# Patient Record
Sex: Female | Born: 1985 | Race: White | Hispanic: No | Marital: Single | Smoking: Never smoker
Health system: Southern US, Community
[De-identification: ages and names within clinical notes are randomized; demographics above are authoritative.]

## PROBLEM LIST (undated history)

## (undated) DIAGNOSIS — M1990 Unspecified osteoarthritis, unspecified site: Secondary | ICD-10-CM

## (undated) DIAGNOSIS — I10 Essential (primary) hypertension: Secondary | ICD-10-CM

## (undated) HISTORY — DX: Unspecified osteoarthritis, unspecified site: M19.90

## (undated) HISTORY — PX: WISDOM TOOTH EXTRACTION: SHX21

## (undated) HISTORY — DX: Essential (primary) hypertension: I10

---
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
## 2016-02-15 DIAGNOSIS — M79672 Pain in left foot: Secondary | ICD-10-CM

## 2016-02-15 DIAGNOSIS — M79671 Pain in right foot: Secondary | ICD-10-CM | POA: Insufficient documentation

## 2016-02-15 DIAGNOSIS — Z79899 Other long term (current) drug therapy: Secondary | ICD-10-CM | POA: Insufficient documentation

## 2016-02-15 DIAGNOSIS — M0579 Rheumatoid arthritis with rheumatoid factor of multiple sites without organ or systems involvement: Secondary | ICD-10-CM | POA: Insufficient documentation

## 2016-02-15 DIAGNOSIS — M79643 Pain in unspecified hand: Secondary | ICD-10-CM | POA: Insufficient documentation

## 2016-02-15 DIAGNOSIS — I7300 Raynaud's syndrome without gangrene: Secondary | ICD-10-CM | POA: Insufficient documentation

## 2016-02-15 NOTE — Progress Notes (Signed)
Office Visit Note  Patient: Ruth Wells             Date of Birth: [birth year]           MRN: [id number]             PCP: No primary care provider on file. Referring: No ref. provider found Visit Date: 02/26/2016 Occupation: [employer]    Subjective:  Hand and wrist pain.  History of Present Illness: Ruth Wells is a 30 y.o. female with history of rheumatoid arthritis. She states in the last few months she's been having recurrent swelling to her hands and wrists joints, and ankles and feet. She has had some discomfort in her shoulder joints and elbow joints. She stopped sulfasalazine in the last week of December as it is not effective. She is interested in starting on methotrexate as it was discussed during the last visit.  Activities of Daily Living:  Patient reports morning stiffness for 30 minutes.   Patient Denies nocturnal pain.  Difficulty dressing/grooming: Denies Difficulty climbing stairs: Reports Difficulty getting out of chair: Denies Difficulty using hands for taps, buttons, cutlery, and/or writing: Denies   Review of Systems  Constitutional: Negative for fatigue, night sweats, weight gain, weight loss and weakness.  HENT: Negative for mouth sores, trouble swallowing, trouble swallowing, mouth dryness and nose dryness.   Eyes: Negative for pain, redness, visual disturbance and dryness.  Respiratory: Negative for cough, shortness of breath and difficulty breathing.   Cardiovascular: Negative for chest pain, palpitations, hypertension, irregular heartbeat and swelling in legs/feet.  Gastrointestinal: Negative for blood in stool, constipation and diarrhea.  Endocrine: Negative for increased urination.  Genitourinary: Negative for vaginal dryness.  Musculoskeletal: Positive for arthralgias, joint pain, joint swelling and morning stiffness. Negative for myalgias, muscle weakness, muscle tenderness and myalgias.  Skin: Negative for color change,  rash, hair loss, skin tightness, ulcers and sensitivity to sunlight.  Allergic/Immunologic: Negative for susceptible to infections.  Neurological: Negative for dizziness, memory loss and night sweats.  Hematological: Negative for swollen glands.  Psychiatric/Behavioral: Negative for depressed mood and sleep disturbance. The patient is not nervous/anxious.     PMFS History:  Patient Active Problem List   Diagnosis Date Noted  . Primary osteoarthritis of both hands 02/24/2016  . Primary osteoarthritis of both feet 02/24/2016  . High risk medication use 02/15/2016  . Raynaud's syndrome without gangrene 02/15/2016  . Rheumatoid arthritis with rheumatoid factor of multiple sites without organ or systems involvement ([place]) 02/15/2016    Past Medical History:  Diagnosis Date  . Arthritis     Family History  Problem Relation Age of Onset  . Arthritis Mother    Past Surgical History:  Procedure Laterality Date  . WISDOM TOOTH EXTRACTION     Social History   Social History Narrative  . No narrative on file     Objective: Vital Signs: BP 139/90 (BP Location: Left Arm, Patient Position: Sitting, Cuff Size: Normal)   Pulse 89   Resp 14   Ht '5\' 5"'  (1.651 m)   Wt 160 lb (72.6 kg)   LMP 01/30/2016   BMI 26.63 kg/m    Physical Exam  Constitutional: She is oriented to person, place, and time. She appears well-developed and well-nourished.  HENT:  Head: Normocephalic and atraumatic.  Eyes: Conjunctivae and EOM are normal.  Neck: Normal range of motion.  Cardiovascular: Normal rate, regular rhythm, normal heart sounds and intact distal pulses.   Pulmonary/Chest: Effort normal  and breath sounds normal.  Abdominal: Soft. Bowel sounds are normal.  Lymphadenopathy:    She has no cervical adenopathy.  Neurological: She is alert and oriented to person, place, and time.  Skin: Skin is warm and dry. Capillary refill takes less than 2 seconds.  Psychiatric: She has a normal mood and  affect. Her behavior is normal.  Nursing note and vitals reviewed.    Musculoskeletal Exam: C-spine, thoracic, lumbar spine good range of motion. No SI joint tenderness. Shoulder joints elbow joints wrist joints with good range of motion. She has tenderness over some of her MCP joints and PIP joints she had synovitis over her left fourth PIP joint. Hip joints, knee joints, ankle joints, MTPs PIPs with good range of motion. She has thickening of her left Achilles tendon probably a rheumatoid nodule.  CDAI Exam: CDAI Homunculus Exam:   Tenderness:  Left hand: 4th PIP  Swelling:  Left hand: 4th PIP  Joint Counts:  CDAI Tender Joint count: 1 CDAI Swollen Joint count: 1  Global Assessments:  Patient Global Assessment: 5 Provider Global Assessment: 5  CDAI Calculated Score: 12    Investigation: Findings:  10/03/2015 HPI:  Ruth Wells is here to get ultrasound examination of her hands and feet due to a new erosive change noted on her recent x-ray.    PROCEDURE:  After informed consent was obtained per EULAR recommendation, ultrasound examination of the bilateral hands was performed.  Using 12 MHz transducer, grayscale and power Doppler, bilateral 2nd, 3rd and 5th MCP joints and bilateral wrist joints both dorsal and volar aspects were evaluated.  The findings were she had no synovitis on examination.  No synovial thickening was noted and no erosive changes were noted.  Right median nerve was 0.05 cm square and left median nerve was 0.08 cm square which were within normal limits.  Bilateral feet ultrasound was also obtained.   Using 12 MHz transducer, grayscale and power Doppler, bilateral 1st, 2nd, 4th and 5th MTP joints both dorsal and volar aspects were evaluated.  She had mild synovitis in her right 2nd and 4th and 5th MTP joint with some synovial thickening.  Erosion was noted on her right 5th MTP joint.  Her left 1st and 2nd MTP joint showed some synovial thickening and mild synovitis  and left 1st MTP joint.  These findings were consistent with inflammatory arthritis.  The erosion was noted in the right 5th MTP joint.     PLAN:  We had detailed discussion regarding these findings.  I did discuss that methotrexate would be more appropriate and more aggressive therapy to control any radiographic progression.  At this point she has not decided starting on methotrexate.    09/24/2015 .   We obtained some x-rays for comparison.  X-rays of bilateral hands, 2 views, showed bilateral PIP narrowing, metacarpocarpal and intercarpal joint space narrowing more so on the right than the left, and there was a questionable right ulnar styloid erosion.  There was no interval change from her x-rays in 2016.   X-rays of bilateral feet, 2 views, showed bilateral PIP and DIP narrowing, right 5th MTP erosion.  I did not have any comparison films since 2011.  There appears to be a new erosion.  10/08/2015 HIV, TB gold, and Hepatitis panel negative , immunoglobulins normal, SPEP negative    Imaging: No results found.  Speciality Comments: No specialty comments available.    Procedures:  No procedures performed Allergies: Patient has no known allergies.   Assessment /  Plan:     Visit Diagnoses: Rheumatoid arthritis with rheumatoid factor  - Positive rheumatoid factor, synovitis and right fifth MTP erosion on [place], negative CCP, negative ANA, negative HLA-B27, history ofJRA. She's not responding to sulfasalazine and Plaquenil combination. We had detailed discussion regarding different treatment options and their side effects. Indications side effects contraindications of methotrexate were discussed at length by Dr. Koleen Nimrod. Handout was given to the patient and a consent was taken. She is on oral contraceptive pills. We will obtain a baseline chest x-ray PA and lateral. I will also obtain labs today. Than the labs will be done every 2 weeks 3 every 2 months then every 3 months based on her response  to the medication. She will also be given prescription for methotrexate 2.5 mg tablets, 4 tablets by mouth every week 2 then 6 tablets by mouth every weeks 2 then 8 tablets by mouth every week. Folic acid 1 mg 2 tablets by mouth daily 90 day supply with 3 refills will be given. She's been given a prescription for pneumococcal vaccine which she can get at a local pharmacy.  High risk medication use - she is only on Plaquenil 200 mg 1 by mouth twice a day Monday to Friday. She discontinued sulfasalazine in the last week of December as it was ineffective.  Raynaud's syndrome without gangrene: Not very active currently  Primary osteoarthritis of both hands: Minimal discomfort  Primary osteoarthritis of both feet : Minimal discomfort  Patient does not have a PCP I have advised her to schedule an appointment with one.   Orders: Orders Placed This Encounter  Procedures  . XR Chest 2 View  . CBC with Differential/Platelet  . COMPLETE METABOLIC PANEL WITH GFR  . CBC with Differential/Platelet  . COMPLETE METABOLIC PANEL WITH GFR   Meds ordered this encounter  Medications  . methotrexate (RHEUMATREX) 2.5 MG tablet    Sig: Take 1 tablet (2.5 mg total) by mouth once a week. Caution:Chemotherapy. Protect from light. Methotrexate 2.5 mg tablets, 4 tablets by mouth every week for 2 weeks then6 tablets by mouth every week for 2 weeks then 8 tablets by mouth every week    Dispense:  36 tablet    Refill:  0  . folic acid (FOLVITE) 1 MG tablet    Sig: Take 2 tablets (2 mg total) by mouth daily.    Dispense:  180 tablet    Refill:  4    Face-to-face time spent with patient was 30 minutes. 50% of time was spent in counseling and coordination of care.  Follow-Up Instructions: Return in about 3 months (around 05/26/2016) for Rheumatoid arthritis.   Bo Merino, MD

## 2016-02-24 DIAGNOSIS — M19041 Primary osteoarthritis, right hand: Secondary | ICD-10-CM | POA: Insufficient documentation

## 2016-02-24 DIAGNOSIS — M19072 Primary osteoarthritis, left ankle and foot: Secondary | ICD-10-CM

## 2016-02-24 DIAGNOSIS — M19071 Primary osteoarthritis, right ankle and foot: Secondary | ICD-10-CM | POA: Insufficient documentation

## 2016-02-24 DIAGNOSIS — M19042 Primary osteoarthritis, left hand: Secondary | ICD-10-CM

## 2016-02-26 ENCOUNTER — Encounter: Payer: Self-pay | Admitting: Rheumatology

## 2016-02-26 ENCOUNTER — Ambulatory Visit (HOSPITAL_COMMUNITY)
Admission: RE | Admit: 2016-02-26 | Discharge: 2016-02-26 | Disposition: A | Discharge status: Home / Self Care | Payer: BC Managed Care – PPO | Source: Ambulatory Visit | Attending: Rheumatology | Admitting: Rheumatology

## 2016-02-26 ENCOUNTER — Ambulatory Visit (INDEPENDENT_AMBULATORY_CARE_PROVIDER_SITE_OTHER): Payer: BC Managed Care – PPO | Admitting: Rheumatology

## 2016-02-26 VITALS — BP 139/90 | HR 89 | Resp 14 | Ht 65.0 in | Wt 160.0 lb

## 2016-02-26 DIAGNOSIS — M0579 Rheumatoid arthritis with rheumatoid factor of multiple sites without organ or systems involvement: Secondary | ICD-10-CM | POA: Diagnosis present

## 2016-02-26 DIAGNOSIS — Z79899 Other long term (current) drug therapy: Secondary | ICD-10-CM | POA: Diagnosis not present

## 2016-02-26 DIAGNOSIS — M19041 Primary osteoarthritis, right hand: Secondary | ICD-10-CM | POA: Diagnosis not present

## 2016-02-26 DIAGNOSIS — Z111 Encounter for screening for respiratory tuberculosis: Secondary | ICD-10-CM

## 2016-02-26 DIAGNOSIS — M19072 Primary osteoarthritis, left ankle and foot: Secondary | ICD-10-CM | POA: Diagnosis not present

## 2016-02-26 DIAGNOSIS — M19042 Primary osteoarthritis, left hand: Secondary | ICD-10-CM

## 2016-02-26 DIAGNOSIS — M19071 Primary osteoarthritis, right ankle and foot: Secondary | ICD-10-CM

## 2016-02-26 DIAGNOSIS — I7300 Raynaud's syndrome without gangrene: Secondary | ICD-10-CM

## 2016-02-26 MED ORDER — FOLIC ACID 1 MG PO TABS: 2.0000 mg | ORAL_TABLET | Freq: Every day | ORAL | 4 refills | Status: DC

## 2016-02-26 MED ORDER — METHOTREXATE 2.5 MG PO TABS: 2.5000 mg | ORAL_TABLET | ORAL | 0 refills | Status: DC

## 2016-02-26 NOTE — Progress Notes (Signed)
Pharmacy Note  Subjective: Patient presents today to the [facility] to see Dr. Estanislado Pandy.  She is currently taking hydroxcyhloroquine 200 mg BID Monday through Friday.  She stopped taking sulfasalazine last week.  Patient was previously counseled on methotrexate on 10/03/15 but at that time she wanted to wait.  Patient reports she is interested in starting methotrexate at this time.  Patient seen by the pharmacist for counseling on methotrexate.    Objective: CBC (09/20/15) WBC: 5.2 K/uL RBC: 4.64 MIL/uL Hgb: 14.6 g/dL Hct: 44 % PLT: 184 K/uL  CMP (09/20/15) SCr: 1.05 mg/dL BUN: 13 mg/dL AST: 22 U/L ALT: 21 U/L  TB Gold: negative (10/03/15) Hepatitis panel: negative (10/03/15) HIV: negative (10/03/15)  Chest-xray:  Ordered today  Assessment/Plan:  Patient was counseled on the purpose, proper use, and adverse effects of methotrexate including nausea, infection, and signs and symptoms of pneumonitis.  Reviewed instructions with patient to take methotrexate weekly along with folic acid daily.  Discussed the importance of frequent monitoring of kidney and liver function and blood counts, and provided patient with standing lab instructions.  Counseled patient to avoid sulfa antibiotics such as Bactrim or Septra while on methotrexate.  Provided patient with educational materials on methotrexate and answered all questions.  Advised patient to get annual influenza vaccine and to get a pneumococcal vaccine if patient has not already had one.  Patient confirms she had a influenza vaccine this year.  Patient was given a prescription for a pneumococcal vaccine.  Patient consented to methotrexate use.  Advised patient to get chest X-ray and pneumococcal vaccine prior to initiation.  Patient voiced understanding.    Most recent hydroxychloroquine eye exam on file is from 09/22/14.  She reports she had a hydroxychloroquine eye exam in July 2017 with Dr. Ellie Lunch at [facility] which she  reports was normal.  I faxed their office with our eye exam form to get the records from this visit.    Elisabeth Most, Pharm.D., BCPS Clinical Pharmacist Pager: [phone] Phone: [phone] 02/26/2016 8:36 AM

## 2016-02-26 NOTE — Addendum Note (Signed)
Addended byCandice Camp on: 02/26/2016 10:06 AM   Modules accepted: Orders

## 2016-02-26 NOTE — Patient Instructions (Addendum)
Please go to [facility] to get a chest X-ray.  We will plan to start methotrexate after your chest X-ray.    Methotrexate instructions:   Take methotrexate 4 tablets (10 mg) weekly for two weeks, get labs 2 weeks after starting, then increase dose to 6 tablets (15 mg) weekly for two weeks, then get labs, then increase dose to 8 tablets (20 mg) weekly.  Get labs again 2 weeks after starting 8 tablets weekly.    Take folic acid 2 mg daily.    Please get a pneumococcal vaccine.   Standing Labs We placed an order today for your standing lab work.    Please come back and get your standing labs every 2 weeks times 3, then every 2 months.  We have open lab Monday through Friday from 8:30-11:30 AM and 1:30-4 PM at the office of Dr. Tresa Moore, PA.   The office is located at [address], [place], [place], [place] [address] No appointment is necessary.   Labs are drawn by [facility].  You may receive a bill from [facility] for your lab work.    Methotrexate tablets What is this medicine? METHOTREXATE (METH oh TREX ate) is a chemotherapy drug used to treat cancer including breast cancer, leukemia, and lymphoma. This medicine can also be used to treat psoriasis and certain kinds of arthritis. This medicine may be used for other purposes; ask your health care provider or pharmacist if you have questions. COMMON BRAND NAME(S): Rheumatrex, Trexall What should I tell my health care provider before I take this medicine? They need to know if you have any of these conditions: -fluid in the stomach area or lungs -if you often drink alcohol -infection or immune system problems -kidney disease or on hemodialysis -liver disease -low blood counts, like low white cell, platelet, or red cell counts -lung disease -radiation therapy -stomach ulcers -ulcerative colitis -an unusual or allergic reaction to methotrexate, other medicines, foods, dyes, or preservatives -pregnant or trying to  get pregnant -breast-feeding How should I use this medicine? Take this medicine by mouth with a glass of water. Follow the directions on the prescription label. Take your medicine at regular intervals. Do not take it more often than directed. Do not stop taking except on your doctor's advice. Make sure you know why you are taking this medicine and how often you should take it. If this medicine is used for a condition that is not cancer, like arthritis or psoriasis, it should be taken weekly, NOT daily. Taking this medicine more often than directed can cause serious side effects, even death. Talk to your healthcare provider about safe handling and disposal of this medicine. You may need to take special precautions. Talk to your pediatrician regarding the use of this medicine in children. While this drug may be prescribed for selected conditions, precautions do apply. Overdosage: If you think you have taken too much of this medicine contact a poison control center or emergency room at once. NOTE: This medicine is only for you. Do not share this medicine with others. What if I miss a dose? If you miss a dose, talk with your doctor or health care professional. Do not take double or extra doses. What may interact with this medicine? This medicine may interact with the following medication: -acitretin -aspirin and aspirin-like medicines including salicylates -azathioprine -certain antibiotics like penicillins, tetracycline, and chloramphenicol -cyclosporine -gold -hydroxychloroquine -live virus vaccines -NSAIDs, medicines for pain and inflammation, like ibuprofen or naproxen -other cytotoxic agents -  penicillamine -phenylbutazone -phenytoin -probenecid -retinoids such as isotretinoin and tretinoin -steroid medicines like prednisone or cortisone -sulfonamides like sulfasalazine and trimethoprim/sulfamethoxazole -theophylline This list may not describe all possible interactions. Give your  health care provider a list of all the medicines, herbs, non-prescription drugs, or dietary supplements you use. Also tell them if you smoke, drink alcohol, or use illegal drugs. Some items may interact with your medicine. What should I watch for while using this medicine? Avoid alcoholic drinks. This medicine can make you more sensitive to the sun. Keep out of the sun. If you cannot avoid being in the sun, wear protective clothing and use sunscreen. Do not use sun lamps or tanning beds/booths. You may need blood work done while you are taking this medicine. Call your doctor or health care professional for advice if you get a fever, chills or sore throat, or other symptoms of a cold or flu. Do not treat yourself. This drug decreases your body's ability to fight infections. Try to avoid being around people who are sick. This medicine may increase your risk to bruise or bleed. Call your doctor or health care professional if you notice any unusual bleeding. Check with your doctor or health care professional if you get an attack of severe diarrhea, nausea and vomiting, or if you sweat a lot. The loss of too much body fluid can make it dangerous for you to take this medicine. Talk to your doctor about your risk of cancer. You may be more at risk for certain types of cancers if you take this medicine. Both men and women must use effective birth control with this medicine. Do not become pregnant while taking this medicine or until at least 1 normal menstrual cycle has occurred after stopping it. Women should inform their doctor if they wish to become pregnant or think they might be pregnant. Men should not father a child while taking this medicine and for 3 months after stopping it. There is a potential for serious side effects to an unborn child. Talk to your health care professional or pharmacist for more information. Do not breast-feed an infant while taking this medicine. What side effects may I notice from  receiving this medicine? Side effects that you should report to your doctor or health care professional as soon as possible: -allergic reactions like skin rash, itching or hives, swelling of the face, lips, or tongue -breathing problems or shortness of breath -diarrhea -dry, nonproductive cough -low blood counts - this medicine may decrease the number of white blood cells, red blood cells and platelets. You may be at increased risk for infections and bleeding. -mouth sores -redness, blistering, peeling or loosening of the skin, including inside the mouth -signs of infection - fever or chills, cough, sore throat, pain or trouble passing urine -signs and symptoms of bleeding such as bloody or black, tarry stools; red or dark-brown urine; spitting up blood or brown material that looks like coffee grounds; red spots on the skin; unusual bruising or bleeding from the eye, gums, or nose -signs and symptoms of kidney injury like trouble passing urine or change in the amount of urine -signs and symptoms of liver injury like dark yellow or brown urine; general ill feeling or flu-like symptoms; light-colored stools; loss of appetite; nausea; right upper belly pain; unusually weak or tired; yellowing of the eyes or skin Side effects that usually do not require medical attention (report to your doctor or health care professional if they continue or are bothersome): -dizziness -hair  loss -tiredness -upset stomach -vomiting This list may not describe all possible side effects. Call your doctor for medical advice about side effects. You may report side effects to FDA at 1-800-FDA-1088. Where should I keep my medicine? Keep out of the reach of children. Store at room temperature between 20 and 25 degrees C (68 and 77 degrees F). Protect from light. Throw away any unused medicine after the expiration date. NOTE: This sheet is a summary. It may not cover all possible information. If you have questions about  this medicine, talk to your doctor, pharmacist, or health care provider.  2017 Elsevier/Gold Standard (2014-10-16 05:39:22)

## 2016-02-27 LAB — CBC WITH DIFFERENTIAL/PLATELET
Basophils Absolute: 0 cells/uL (ref 0–200)
Basophils Relative: 0 %
EOS PCT: 0 %
Eosinophils Absolute: 0 cells/uL — ABNORMAL LOW (ref 15–500)
HCT: 46.1 % — ABNORMAL HIGH (ref 35.0–45.0)
HEMOGLOBIN: 15.3 g/dL (ref 11.7–15.5)
LYMPHS ABS: 2303 {cells}/uL (ref 850–3900)
LYMPHS PCT: 49 %
MCH: 30.9 pg (ref 27.0–33.0)
MCHC: 33.2 g/dL (ref 32.0–36.0)
MCV: 93.1 fL (ref 80.0–100.0)
MONOS PCT: 8 %
MPV: 9.7 fL (ref 7.5–12.5)
Monocytes Absolute: 376 cells/uL (ref 200–950)
NEUTROS PCT: 43 %
Neutro Abs: 2021 cells/uL (ref 1500–7800)
PLATELETS: 171 10*3/uL (ref 140–400)
RBC: 4.95 MIL/uL (ref 3.80–5.10)
RDW: 13.1 % (ref 11.0–15.0)
WBC: 4.7 10*3/uL (ref 3.8–10.8)

## 2016-02-27 LAB — COMPLETE METABOLIC PANEL WITH GFR
ALT: 15 U/L (ref 6–29)
AST: 20 U/L (ref 10–30)
Albumin: 4.1 g/dL (ref 3.6–5.1)
Alkaline Phosphatase: 47 U/L (ref 33–115)
BUN: 11 mg/dL (ref 7–25)
CHLORIDE: 106 mmol/L (ref 98–110)
CO2: 26 mmol/L (ref 20–31)
Calcium: 9 mg/dL (ref 8.6–10.2)
Creat: 0.93 mg/dL (ref 0.50–1.10)
GFR, EST NON AFRICAN AMERICAN: 83 mL/min (ref >=60)
GFR, Est African American: >89 mL/min (ref >=60)
GLUCOSE: 82 mg/dL (ref 65–99)
POTASSIUM: 4.5 mmol/L (ref 3.5–5.3)
SODIUM: 139 mmol/L (ref 135–146)
Total Bilirubin: 0.4 mg/dL (ref 0.2–1.2)
Total Protein: 6.6 g/dL (ref 6.1–8.1)

## 2016-02-27 NOTE — Progress Notes (Signed)
Labs normal.

## 2016-03-14 ENCOUNTER — Telehealth: Payer: Self-pay | Admitting: Pharmacist

## 2016-03-14 NOTE — Telephone Encounter (Signed)
Received call from patient stating she took her first dose of methotrexate last Friday.  She took 4 tablets and reports she tolerated the methotrexate well.  She confirms she is taking folic acid 2mg  daily.  Patient reports she got a cold this week, and asked whether she should hold her methotrexate dose this week.  I advised patient that she should hold her methotrexate until her cold has resolved.  I reminded patient that she should get standing labs after she has been on the medication for two weeks.  Patient voiced understanding.     Elisabeth Most, Pharm.D., BCPS, CPP Clinical Pharmacist Pager: [phone] Phone: [phone] 03/14/2016 8:12 AM

## 2016-03-18 ENCOUNTER — Encounter: Payer: Self-pay | Admitting: Rheumatology

## 2016-03-18 NOTE — Progress Notes (Signed)
  Patient: Ruth Wells  Plaquenil eye exam received from [facility].  Date of eye exam: 09/24/2015 Plaquenil toxicity was: Normal Plaquenil should be: Continued Date of follow-up eye exam: 12 months  Eye exam done by Dr. Ellie Lunch at [facility] [phone]  [name], PA-C

## 2016-03-21 ENCOUNTER — Other Ambulatory Visit: Payer: Self-pay | Admitting: Rheumatology

## 2016-03-21 NOTE — Telephone Encounter (Signed)
Last Visit: 02/26/16 Next Visit: 06/16/16 Labs: 02/26/16 WNL PLQ Eye Exam: 09/24/15 WNL  Okay to refill PLQ?

## 2016-03-21 NOTE — Telephone Encounter (Signed)
ok 

## 2016-03-26 ENCOUNTER — Other Ambulatory Visit: Payer: Self-pay | Admitting: *Deleted

## 2016-03-26 DIAGNOSIS — Z79899 Other long term (current) drug therapy: Secondary | ICD-10-CM

## 2016-03-26 LAB — CBC WITH DIFFERENTIAL/PLATELET
BASOS PCT: 0 %
Basophils Absolute: 0 cells/uL (ref 0–200)
Eosinophils Absolute: 0 cells/uL — ABNORMAL LOW (ref 15–500)
Eosinophils Relative: 0 %
HCT: 44.6 % (ref 35.0–45.0)
Hemoglobin: 15.1 g/dL (ref 11.7–15.5)
LYMPHS PCT: 47 %
Lymphs Abs: 2538 cells/uL (ref 850–3900)
MCH: 30.8 pg (ref 27.0–33.0)
MCHC: 33.9 g/dL (ref 32.0–36.0)
MCV: 90.8 fL (ref 80.0–100.0)
MPV: 9.8 fL (ref 7.5–12.5)
Monocytes Absolute: 378 cells/uL (ref 200–950)
Monocytes Relative: 7 %
Neutro Abs: 2484 cells/uL (ref 1500–7800)
Neutrophils Relative %: 46 %
PLATELETS: 205 10*3/uL (ref 140–400)
RBC: 4.91 MIL/uL (ref 3.80–5.10)
RDW: 13.1 % (ref 11.0–15.0)
WBC: 5.4 10*3/uL (ref 3.8–10.8)

## 2016-03-27 LAB — COMPLETE METABOLIC PANEL WITH GFR
ALT: 23 U/L (ref 6–29)
AST: 20 U/L (ref 10–30)
Albumin: 4.4 g/dL (ref 3.6–5.1)
Alkaline Phosphatase: 47 U/L (ref 33–115)
BUN: 11 mg/dL (ref 7–25)
CHLORIDE: 106 mmol/L (ref 98–110)
CO2: 21 mmol/L (ref 20–31)
CREATININE: 0.87 mg/dL (ref 0.50–1.10)
Calcium: 8.9 mg/dL (ref 8.6–10.2)
GFR, Est Non African American: >89 mL/min (ref >=60)
Glucose, Bld: 77 mg/dL (ref 65–99)
POTASSIUM: 4.5 mmol/L (ref 3.5–5.3)
Sodium: 141 mmol/L (ref 135–146)
Total Bilirubin: 0.3 mg/dL (ref 0.2–1.2)
Total Protein: 7.1 g/dL (ref 6.1–8.1)

## 2016-04-09 ENCOUNTER — Other Ambulatory Visit: Payer: Self-pay | Admitting: *Deleted

## 2016-04-09 DIAGNOSIS — Z79899 Other long term (current) drug therapy: Secondary | ICD-10-CM

## 2016-04-09 LAB — CBC WITH DIFFERENTIAL/PLATELET
BASOS PCT: 0 %
Basophils Absolute: 0 cells/uL (ref 0–200)
Eosinophils Absolute: 0 cells/uL — ABNORMAL LOW (ref 15–500)
Eosinophils Relative: 0 %
HCT: 42.9 % (ref 35.0–45.0)
Hemoglobin: 14.4 g/dL (ref 11.7–15.5)
LYMPHS PCT: 45 %
Lymphs Abs: 2520 cells/uL (ref 850–3900)
MCH: 30.8 pg (ref 27.0–33.0)
MCHC: 33.6 g/dL (ref 32.0–36.0)
MCV: 91.9 fL (ref 80.0–100.0)
MONOS PCT: 5 %
MPV: 9.7 fL (ref 7.5–12.5)
Monocytes Absolute: 280 cells/uL (ref 200–950)
Neutro Abs: 2800 cells/uL (ref 1500–7800)
Neutrophils Relative %: 50 %
PLATELETS: 177 10*3/uL (ref 140–400)
RBC: 4.67 MIL/uL (ref 3.80–5.10)
RDW: 13.4 % (ref 11.0–15.0)
WBC: 5.6 10*3/uL (ref 3.8–10.8)

## 2016-04-10 LAB — COMPLETE METABOLIC PANEL WITH GFR
ALT: 13 U/L (ref 6–29)
AST: 16 U/L (ref 10–30)
Albumin: 3.9 g/dL (ref 3.6–5.1)
Alkaline Phosphatase: 44 U/L (ref 33–115)
BUN: 10 mg/dL (ref 7–25)
CHLORIDE: 107 mmol/L (ref 98–110)
CO2: 22 mmol/L (ref 20–31)
CREATININE: 0.98 mg/dL (ref 0.50–1.10)
Calcium: 8.9 mg/dL (ref 8.6–10.2)
GFR, Est Non African American: 78 mL/min (ref >=60)
Glucose, Bld: 120 mg/dL — ABNORMAL HIGH (ref 65–99)
Potassium: 3.8 mmol/L (ref 3.5–5.3)
SODIUM: 140 mmol/L (ref 135–146)
Total Bilirubin: 0.3 mg/dL (ref 0.2–1.2)
Total Protein: 6.3 g/dL (ref 6.1–8.1)

## 2016-04-22 ENCOUNTER — Telehealth: Payer: Self-pay | Admitting: Rheumatology

## 2016-04-22 ENCOUNTER — Other Ambulatory Visit: Payer: Self-pay | Admitting: Rheumatology

## 2016-04-22 MED ORDER — METHOTREXATE 2.5 MG PO TABS: 20.0000 mg | ORAL_TABLET | ORAL | 2 refills | Status: DC

## 2016-04-22 NOTE — Telephone Encounter (Signed)
Added pharmacy to patient's chart.

## 2016-04-22 NOTE — Telephone Encounter (Signed)
Patient called to let you know that she uses [facility] at [address].

## 2016-04-22 NOTE — Telephone Encounter (Signed)
Left message for patient to contact the office with pharmacy information.   Last Visit: 02/26/16 Next visit: 06/16/16 Labs: 04/09/16 Elevated glucose  Okay to refill MTX?

## 2016-04-22 NOTE — Telephone Encounter (Signed)
ok 

## 2016-04-22 NOTE — Telephone Encounter (Signed)
Patient called stating that she has finished her first round of MTX and is needing her prescription refilled.  [phone].  Thank you.

## 2016-04-24 ENCOUNTER — Telehealth: Payer: Self-pay | Admitting: Rheumatology

## 2016-04-24 ENCOUNTER — Other Ambulatory Visit: Payer: Self-pay | Admitting: Rheumatology

## 2016-04-24 NOTE — Telephone Encounter (Signed)
Patient advised prescription has been faxed to the pharmacy.  

## 2016-04-24 NOTE — Telephone Encounter (Addendum)
Patient is requesting refill of MTX to be resent to [facility] on [address].

## 2016-06-03 NOTE — Progress Notes (Signed)
Office Visit Note  Patient: Ruth Wells             Date of Birth: 1985-02-26           MRN: [id number]             PCP: Logan Bores, MD Referring: Paula Compton, MD Visit Date: 06/16/2016 Occupation: _0 @    Subjective:  Left foot pain   History of Present Illness: Ruth Wells is a 31 y.o. female on methotrexate since mid-January. She states she's doing much better with decreased stiffness and decreased pain in her joints. She had an episode towards the end of March when she developed pain and swelling in her left first toe.  Activities of Daily Living:  Patient reports morning stiffness for 0 minute.   Patient Denies nocturnal pain.  Difficulty dressing/grooming: Denies Difficulty climbing stairs: Denies Difficulty getting out of chair: Denies Difficulty using hands for taps, buttons, cutlery, and/or writing: Denies   Review of Systems  Constitutional: Negative for fatigue, night sweats, weight gain, weight loss and weakness.  HENT: Negative for mouth sores, trouble swallowing, trouble swallowing, mouth dryness and nose dryness.   Eyes: Negative for pain, redness, visual disturbance and dryness.  Respiratory: Negative for cough, shortness of breath and difficulty breathing.   Cardiovascular: Negative for chest pain, palpitations, hypertension, irregular heartbeat and swelling in legs/feet.  Gastrointestinal: Negative for blood in stool, constipation and diarrhea.  Endocrine: Negative for increased urination.  Genitourinary: Negative for vaginal dryness.  Musculoskeletal: Positive for arthralgias and joint pain. Negative for joint swelling, myalgias, muscle weakness, morning stiffness, muscle tenderness and myalgias.  Skin: Negative for color change, rash, hair loss, skin tightness, ulcers and sensitivity to sunlight.  Allergic/Immunologic: Negative for susceptible to infections.  Neurological: Negative for dizziness, memory loss and night  sweats.  Hematological: Negative for swollen glands.  Psychiatric/Behavioral: Negative for depressed mood and sleep disturbance. The patient is not nervous/anxious.     PMFS History:  Patient Active Problem List   Diagnosis Date Noted  . Primary osteoarthritis of both hands 02/24/2016  . Primary osteoarthritis of both feet 02/24/2016  . High risk medication use 02/15/2016  . Raynaud's syndrome without gangrene 02/15/2016  . Rheumatoid arthritis with rheumatoid factor of multiple sites without organ or systems involvement ([place]) 02/15/2016    Past Medical History:  Diagnosis Date  . Arthritis     Family History  Problem Relation Age of Onset  . Arthritis Mother    Past Surgical History:  Procedure Laterality Date  . WISDOM TOOTH EXTRACTION     Social History   Social History Narrative  . No narrative on file     Objective: Vital Signs: BP 136/85 (BP Location: Left Arm, Patient Position: Sitting, Cuff Size: Normal)   Pulse 83   Resp 12   Ht 5' 5" (1.651 m)   Wt 160 lb (72.6 kg)   LMP 05/24/2016   BMI 26.63 kg/m    Physical Exam  Constitutional: She is oriented to person, place, and time. She appears well-developed and well-nourished.  HENT:  Head: Normocephalic and atraumatic.  Eyes: Conjunctivae and EOM are normal.  Neck: Normal range of motion.  Cardiovascular: Normal rate, regular rhythm, normal heart sounds and intact distal pulses.   Pulmonary/Chest: Effort normal and breath sounds normal.  Abdominal: Soft. Bowel sounds are normal.  Lymphadenopathy:    She has no cervical adenopathy.  Neurological: She is alert and oriented to person, place, and time.  Skin:  Skin is warm and dry. Capillary refill takes less than 2 seconds.  Psychiatric: She has a normal mood and affect. Her behavior is normal.  Nursing note and vitals reviewed.    Musculoskeletal Exam: Good range of motion. Shoulder joints elbow joints wrist joint MCPs PIPs DIPs with good range of motion  with no synovitis. MTPs PIPs DIPs with good range of motion with no synovitis or tenderness.  CDAI Exam: CDAI Homunculus Exam:   Joint Counts:  CDAI Tender Joint count: 0 CDAI Swollen Joint count: 0  Global Assessments:  Patient Global Assessment: 2 Provider Global Assessment: 2  CDAI Calculated Score: 4    Investigation: No additional findings. Orders Only on 04/09/2016  Component Date Value Ref Range Status  . WBC 04/09/2016 5.6  3.8 - 10.8 K/uL Final  . RBC 04/09/2016 4.67  3.80 - 5.10 MIL/uL Final  . Hemoglobin 04/09/2016 14.4  11.7 - 15.5 g/dL Final  . HCT 04/09/2016 42.9  35.0 - 45.0 % Final  . MCV 04/09/2016 91.9  80.0 - 100.0 fL Final  . MCH 04/09/2016 30.8  27.0 - 33.0 pg Final  . MCHC 04/09/2016 33.6  32.0 - 36.0 g/dL Final  . RDW 04/09/2016 13.4  11.0 - 15.0 % Final  . Platelets 04/09/2016 177  140 - 400 K/uL Final  . MPV 04/09/2016 9.7  7.5 - 12.5 fL Final  . Neutro Abs 04/09/2016 2800  1,500 - 7,800 cells/uL Final  . Lymphs Abs 04/09/2016 2520  850 - 3,900 cells/uL Final  . Monocytes Absolute 04/09/2016 280  200 - 950 cells/uL Final  . Eosinophils Absolute 04/09/2016 0* 15 - 500 cells/uL Final  . Basophils Absolute 04/09/2016 0  0 - 200 cells/uL Final  . Neutrophils Relative % 04/09/2016 50  % Final  . Lymphocytes Relative 04/09/2016 45  % Final  . Monocytes Relative 04/09/2016 5  % Final  . Eosinophils Relative 04/09/2016 0  % Final  . Basophils Relative 04/09/2016 0  % Final  . Smear Review 04/09/2016 Criteria for review not met   Final  . Sodium 04/09/2016 140  135 - 146 mmol/L Final  . Potassium 04/09/2016 3.8  3.5 - 5.3 mmol/L Final  . Chloride 04/09/2016 107  98 - 110 mmol/L Final  . CO2 04/09/2016 22  20 - 31 mmol/L Final  . Glucose, Bld 04/09/2016 120* 65 - 99 mg/dL Final  . BUN 04/09/2016 10  7 - 25 mg/dL Final  . Creat 04/09/2016 0.98  0.50 - 1.10 mg/dL Final  . Total Bilirubin 04/09/2016 0.3  0.2 - 1.2 mg/dL Final  . Alkaline Phosphatase  04/09/2016 44  33 - 115 U/L Final  . AST 04/09/2016 16  10 - 30 U/L Final  . ALT 04/09/2016 13  6 - 29 U/L Final  . Total Protein 04/09/2016 6.3  6.1 - 8.1 g/dL Final  . Albumin 04/09/2016 3.9  3.6 - 5.1 g/dL Final  . Calcium 04/09/2016 8.9  8.6 - 10.2 mg/dL Final  . GFR, Est African American 04/09/2016 >89  >=60 mL/min Final  . GFR, Est Non African American 04/09/2016 78  >=60 mL/min Final  Orders Only on 03/26/2016  Component Date Value Ref Range Status  . WBC 03/26/2016 5.4  3.8 - 10.8 K/uL Final  . RBC 03/26/2016 4.91  3.80 - 5.10 MIL/uL Final  . Hemoglobin 03/26/2016 15.1  11.7 - 15.5 g/dL Final  . HCT 03/26/2016 44.6  35.0 - 45.0 % Final  . MCV 03/26/2016 90.8  80.0 - 100.0 fL Final  . MCH 03/26/2016 30.8  27.0 - 33.0 pg Final  . MCHC 03/26/2016 33.9  32.0 - 36.0 g/dL Final  . RDW 03/26/2016 13.1  11.0 - 15.0 % Final  . Platelets 03/26/2016 205  140 - 400 K/uL Final  . MPV 03/26/2016 9.8  7.5 - 12.5 fL Final  . Neutro Abs 03/26/2016 2484  1,500 - 7,800 cells/uL Final  . Lymphs Abs 03/26/2016 2538  850 - 3,900 cells/uL Final  . Monocytes Absolute 03/26/2016 378  200 - 950 cells/uL Final  . Eosinophils Absolute 03/26/2016 0* 15 - 500 cells/uL Final  . Basophils Absolute 03/26/2016 0  0 - 200 cells/uL Final  . Neutrophils Relative % 03/26/2016 46  % Final  . Lymphocytes Relative 03/26/2016 47  % Final  . Monocytes Relative 03/26/2016 7  % Final  . Eosinophils Relative 03/26/2016 0  % Final  . Basophils Relative 03/26/2016 0  % Final  . Smear Review 03/26/2016 Criteria for review not met   Final  . Sodium 03/26/2016 141  135 - 146 mmol/L Final  . Potassium 03/26/2016 4.5  3.5 - 5.3 mmol/L Final  . Chloride 03/26/2016 106  98 - 110 mmol/L Final  . CO2 03/26/2016 21  20 - 31 mmol/L Final  . Glucose, Bld 03/26/2016 77  65 - 99 mg/dL Final  . BUN 03/26/2016 11  7 - 25 mg/dL Final  . Creat 03/26/2016 0.87  0.50 - 1.10 mg/dL Final  . Total Bilirubin 03/26/2016 0.3  0.2 - 1.2 mg/dL  Final  . Alkaline Phosphatase 03/26/2016 47  33 - 115 U/L Final  . AST 03/26/2016 20  10 - 30 U/L Final  . ALT 03/26/2016 23  6 - 29 U/L Final  . Total Protein 03/26/2016 7.1  6.1 - 8.1 g/dL Final  . Albumin 03/26/2016 4.4  3.6 - 5.1 g/dL Final  . Calcium 03/26/2016 8.9  8.6 - 10.2 mg/dL Final  . GFR, Est African American 03/26/2016 >89  >=60 mL/min Final  . GFR, Est Non African American 03/26/2016 >89  >=60 mL/min Final     Imaging: No results found.  Speciality Comments: No specialty comments available.    Procedures:  No procedures performed Allergies: Patient has no known allergies.   Assessment / Plan:     Visit Diagnoses: Rheumatoid arthritis with rheumatoid factor of multiple sites without organ or systems involvement ([place]) - Positive RF, erosive disease with synovitis, history of JRA. She is doing better on MTX. She had an episode with left first MTP pain. It is difficult to say if it was related to RA flare or injury.We discussed if she has another flare then we will increase her MTX to 10 tabs po q week . We will monitor her on current dose for now.Pt. Will notify me if she has a flare.   High risk medication use - Methotrexate8 tab/week, folic acid po 74m qd, Plaquenil 200 mg Am and 100 mg PM - Plan: CBC with Differential/Platelet, COMPLETE METABOLIC PANEL WITH GFR  Primary osteoarthritis of both hands  Primary osteoarthritis of both feet  Raynaud's syndrome without gangrene; not active currently.     Orders: No orders of the defined types were placed in this encounter.  Meds ordered this encounter  Medications  . methotrexate (RHEUMATREX) 2.5 MG tablet    Sig: Take 8 tablets (20 mg total) by mouth once a week. Caution:Chemotherapy. Protect from light.    Dispense:  32 tablet  Refill:  2    Face-to-face time spent with patient was 30 minutes. 50% of time was spent in counseling and coordination of care.  Follow-Up Instructions: Return in about 4 months  (around 10/16/2016) for Rheumatoid arthritis.   Bo Merino, MD  Note - This record has been created using Editor, commissioning.  Chart creation errors have been sought, but may not always  have been located. Such creation errors do not reflect on  the standard of medical care.

## 2016-06-16 ENCOUNTER — Encounter: Payer: Self-pay | Admitting: Rheumatology

## 2016-06-16 ENCOUNTER — Ambulatory Visit (INDEPENDENT_AMBULATORY_CARE_PROVIDER_SITE_OTHER): Payer: BC Managed Care – PPO | Admitting: Rheumatology

## 2016-06-16 VITALS — BP 136/85 | HR 83 | Resp 12 | Ht 65.0 in | Wt 160.0 lb

## 2016-06-16 DIAGNOSIS — I7300 Raynaud's syndrome without gangrene: Secondary | ICD-10-CM

## 2016-06-16 DIAGNOSIS — M0579 Rheumatoid arthritis with rheumatoid factor of multiple sites without organ or systems involvement: Secondary | ICD-10-CM

## 2016-06-16 DIAGNOSIS — M19041 Primary osteoarthritis, right hand: Secondary | ICD-10-CM | POA: Diagnosis not present

## 2016-06-16 DIAGNOSIS — Z79899 Other long term (current) drug therapy: Secondary | ICD-10-CM | POA: Diagnosis not present

## 2016-06-16 DIAGNOSIS — M19071 Primary osteoarthritis, right ankle and foot: Secondary | ICD-10-CM | POA: Diagnosis not present

## 2016-06-16 DIAGNOSIS — M19072 Primary osteoarthritis, left ankle and foot: Secondary | ICD-10-CM | POA: Diagnosis not present

## 2016-06-16 DIAGNOSIS — M19042 Primary osteoarthritis, left hand: Secondary | ICD-10-CM

## 2016-06-16 LAB — COMPLETE METABOLIC PANEL WITH GFR
ALT: 15 U/L (ref 6–29)
AST: 15 U/L (ref 10–30)
Albumin: 4 g/dL (ref 3.6–5.1)
Alkaline Phosphatase: 42 U/L (ref 33–115)
BILIRUBIN TOTAL: 0.4 mg/dL (ref 0.2–1.2)
BUN: 11 mg/dL (ref 7–25)
CHLORIDE: 107 mmol/L (ref 98–110)
CO2: 23 mmol/L (ref 20–31)
CREATININE: 0.85 mg/dL (ref 0.50–1.10)
Calcium: 8.8 mg/dL (ref 8.6–10.2)
GFR, Est Non African American: >89 mL/min (ref >=60)
Glucose, Bld: 89 mg/dL (ref 65–99)
Potassium: 4.4 mmol/L (ref 3.5–5.3)
Sodium: 138 mmol/L (ref 135–146)
Total Protein: 6.6 g/dL (ref 6.1–8.1)

## 2016-06-16 LAB — CBC WITH DIFFERENTIAL/PLATELET
BASOS PCT: 0 %
Basophils Absolute: 0 cells/uL (ref 0–200)
EOS ABS: 48 {cells}/uL (ref 15–500)
Eosinophils Relative: 1 %
HCT: 42.6 % (ref 35.0–45.0)
Hemoglobin: 14 g/dL (ref 11.7–15.5)
LYMPHS PCT: 38 %
Lymphs Abs: 1824 cells/uL (ref 850–3900)
MCH: 30.9 pg (ref 27.0–33.0)
MCHC: 32.9 g/dL (ref 32.0–36.0)
MCV: 94 fL (ref 80.0–100.0)
MONOS PCT: 5 %
MPV: 9.9 fL (ref 7.5–12.5)
Monocytes Absolute: 240 cells/uL (ref 200–950)
Neutro Abs: 2688 cells/uL (ref 1500–7800)
Neutrophils Relative %: 56 %
PLATELETS: 209 10*3/uL (ref 140–400)
RBC: 4.53 MIL/uL (ref 3.80–5.10)
RDW: 14.8 % (ref 11.0–15.0)
WBC: 4.8 10*3/uL (ref 3.8–10.8)

## 2016-06-16 MED ORDER — METHOTREXATE 2.5 MG PO TABS: 20.0000 mg | ORAL_TABLET | ORAL | 2 refills | Status: DC

## 2016-06-16 NOTE — Patient Instructions (Signed)
Standing Labs We placed an order today for your standing lab work.    Please come back and get your standing labs in July   We have open lab Monday through Friday from 8:30-11:30 AM and 1:30-4 PM at the office of Dr. Tresa Moore, PA.   The office is located at [address], [place], [place], [place] [address] No appointment is necessary.   Labs are drawn by [facility].  You may receive a bill from [facility] for your lab work.

## 2016-06-17 ENCOUNTER — Telehealth: Payer: Self-pay | Admitting: Radiology

## 2016-06-17 NOTE — Telephone Encounter (Signed)
-----   Message from Bo Merino, MD sent at 06/17/2016 12:25 PM EDT ----- WNL

## 2016-06-17 NOTE — Progress Notes (Signed)
WNL

## 2016-06-17 NOTE — Telephone Encounter (Signed)
I have called patient to advise labs are normal  

## 2016-08-04 ENCOUNTER — Other Ambulatory Visit: Payer: Self-pay | Admitting: Rheumatology

## 2016-08-04 NOTE — Telephone Encounter (Signed)
Last Visit: 06/16/16 Next Visit: 10/07/16 Labs: 06/16/16 WNL PLQ Eye Exam: 09/24/15 WNL  Okay to refill PLQ?

## 2016-08-31 ENCOUNTER — Other Ambulatory Visit: Payer: Self-pay | Admitting: Rheumatology

## 2016-09-01 NOTE — Telephone Encounter (Signed)
Last Visit: 06/16/16 Next Visit: 10/07/16 Labs: 06/16/16 WNL  Okay to refill per Dr. Estanislado Pandy

## 2016-09-30 NOTE — Progress Notes (Signed)
Office Visit Note  Patient: Ruth Wells             Date of Birth: [birth year]           MRN: [id number]             PCP: Paula Compton, MD Referring: Paula Compton, MD Visit Date: 10/07/2016 Occupation: @GUAROCC @    Subjective:  Pain in hands.   History of Present Illness: Ruth Wells is a 31 y.o. female history of some sero positive rheumatoid arthritis. She states she has done fairly well since the last visit. She had 3 minor flares and she had pain and discomfort lasting and 1 single joint for 12-24 hours. Yesterday she had to do more strenuous job at work with heavy lifting and she's having increased pain and discomfort in her hands today. She is overall satisfied with her current therapy and feels like she has improved on the current treatment. Her Raynaud's has not been active recently. She's not having much discomfort in her feet.   Activities of Daily Living:  Patient reports morning stiffness for 0 minute.   Patient Denies nocturnal pain.  Difficulty dressing/grooming: Denies Difficulty climbing stairs: Denies Difficulty getting out of chair: Denies Difficulty using hands for taps, buttons, cutlery, and/or writing: Denies   Review of Systems  Constitutional: Negative.  Negative for fatigue, night sweats, weight gain, weight loss and weakness.  HENT: Negative for mouth sores, trouble swallowing, trouble swallowing, mouth dryness and nose dryness.   Eyes: Negative for pain, redness, visual disturbance and dryness.  Respiratory: Negative.  Negative for cough, shortness of breath and difficulty breathing.   Cardiovascular: Negative.  Negative for chest pain, palpitations, hypertension, irregular heartbeat and swelling in legs/feet.  Gastrointestinal: Negative.  Negative for blood in stool, constipation and diarrhea.  Endocrine: Negative.  Negative for increased urination.  Genitourinary: Negative.  Negative for nocturia and vaginal dryness.    Musculoskeletal: Positive for joint swelling. Negative for arthralgias, joint pain, myalgias, muscle weakness, morning stiffness, muscle tenderness and myalgias.  Skin: Positive for rash, hair loss and sensitivity to sunlight. Negative for color change, skin tightness and ulcers.  Allergic/Immunologic: Negative for susceptible to infections.  Neurological: Negative.  Negative for dizziness, headaches, memory loss and night sweats.  Hematological: Negative.  Negative for swollen glands.  Psychiatric/Behavioral: Negative.  Negative for depressed mood and sleep disturbance. The patient is not nervous/anxious.     PMFS History:  Patient Active Problem List   Diagnosis Date Noted  . Primary osteoarthritis of both hands 02/24/2016  . Primary osteoarthritis of both feet 02/24/2016  . High risk medication use 02/15/2016  . Raynaud's syndrome without gangrene 02/15/2016  . Rheumatoid arthritis with rheumatoid factor of multiple sites without organ or systems involvement ([place]) 02/15/2016    Past Medical History:  Diagnosis Date  . Arthritis     Family History  Problem Relation Age of Onset  . Arthritis Mother    Past Surgical History:  Procedure Laterality Date  . WISDOM TOOTH EXTRACTION     Social History   Social History Narrative  . No narrative on file     Objective: Vital Signs: BP (!) 146/100   Pulse 86   Resp 14   Ht 5\' 5"  (1.651 m)   Wt 163 lb (73.9 kg)   BMI 27.12 kg/m    Physical Exam  Constitutional: She is oriented to person, place, and time. She appears well-developed and well-nourished.  HENT:  Head: Normocephalic and atraumatic.  Eyes: Conjunctivae and EOM are normal.  Neck: Normal range of motion.  Cardiovascular: Normal rate, regular rhythm, normal heart sounds and intact distal pulses.   Pulmonary/Chest: Effort normal and breath sounds normal.  Abdominal: Soft. Bowel sounds are normal.  Lymphadenopathy:    She has no cervical adenopathy.   Neurological: She is alert and oriented to person, place, and time.  Skin: Skin is warm and dry. Capillary refill takes less than 2 seconds.  Psychiatric: She has a normal mood and affect. Her behavior is normal.  Nursing note and vitals reviewed.    Musculoskeletal Exam: C-spine and thoracic lumbar spine good range of motion. Shoulder joints elbow joints wrist joints with good range of motion. She is some tenderness over her right hand MCPs and left wrist joint but no synovitis was noted. Hip joints knee joints ankles MTPs PIPs DIPs with good range of motion with no synovitis.  CDAI Exam: CDAI Homunculus Exam:   Tenderness:  LUE: wrist Right hand: 2nd MCP and 3rd MCP  Joint Counts:  CDAI Tender Joint count: 3 CDAI Swollen Joint count: 0  Global Assessments:  Patient Global Assessment: 2 Provider Global Assessment: 2  CDAI Calculated Score: 7    Investigation: Findings:  08/2015 eye exam shows no plaquenil toxicity    CBC Latest Ref Rng & Units 06/16/2016 04/09/2016 03/26/2016  WBC 3.8 - 10.8 K/uL 4.8 5.6 5.4  Hemoglobin 11.7 - 15.5 g/dL 14.0 14.4 15.1  Hematocrit 35.0 - 45.0 % 42.6 42.9 44.6  Platelets 140 - 400 K/uL 209 177 205   CMP Latest Ref Rng & Units 06/16/2016 04/09/2016 03/26/2016  Glucose 65 - 99 mg/dL 89 120(H) 77  BUN 7 - 25 mg/dL 11 10 11   Creatinine 0.50 - 1.10 mg/dL 0.85 0.98 0.87  Sodium 135 - 146 mmol/L 138 140 141  Potassium 3.5 - 5.3 mmol/L 4.4 3.8 4.5  Chloride 98 - 110 mmol/L 107 107 106  CO2 20 - 31 mmol/L 23 22 21   Calcium 8.6 - 10.2 mg/dL 8.8 8.9 8.9  Total Protein 6.1 - 8.1 g/dL 6.6 6.3 7.1  Total Bilirubin 0.2 - 1.2 mg/dL 0.4 0.3 0.3  Alkaline Phos 33 - 115 U/L 42 44 47  AST 10 - 30 U/L 15 16 20   ALT 6 - 29 U/L 15 13 23    Imaging: No results found.  Speciality Comments: No specialty comments available.    Procedures:  No procedures performed Allergies: Patient has no known allergies.   Assessment / Plan:     Visit Diagnoses:  Rheumatoid arthritis with rheumatoid factor of multiple sites without organ or systems involvement ([place]) - Positive RF, negative anti-CCP erosive disease, history of JRA. Patient had no synovitis on examination. Although she's having increased discomfort and tenderness in her joints after increased activity yesterday.  High risk medication use - Methotrexate 8 tablets by mouth every week, folic acid 2 mg by mouth daily, and Plaquenil 200 mg by mouth twice a day Monday through Friday - Plan: CBC with Differential/Platelet, COMPLETE METABOLIC PANEL WITH GFR today and then every 3 months to monitor for drug toxicity. At this point she is some satisfied with the treatment and does not want to modify treatment.  Raynaud's syndrome without gangrene: Currently not active.  Primary osteoarthritis of both hands: Having some stiffness in her hands.  Primary osteoarthritis of both feet : She is not having much discomfort in her feet.   Orders: Orders Placed This Encounter  Procedures  . CBC with  Differential/Platelet  . COMPLETE METABOLIC PANEL WITH GFR   No orders of the defined types were placed in this encounter.   Face-to-face time spent with patient was minutes. [place] 50% of time was spent in counseling and coordination of care.    Bo Merino, MD  Note - This record has been created using Editor, commissioning.  Chart creation errors have been sought, but may not always  have been located. Such creation errors do not reflect on  the standard of medical care.

## 2016-10-07 ENCOUNTER — Encounter: Payer: Self-pay | Admitting: Rheumatology

## 2016-10-07 ENCOUNTER — Ambulatory Visit (INDEPENDENT_AMBULATORY_CARE_PROVIDER_SITE_OTHER): Payer: BC Managed Care – PPO | Admitting: Rheumatology

## 2016-10-07 VITALS — BP 146/100 | HR 86 | Resp 14 | Ht 65.0 in | Wt 163.0 lb

## 2016-10-07 DIAGNOSIS — Z79899 Other long term (current) drug therapy: Secondary | ICD-10-CM | POA: Diagnosis not present

## 2016-10-07 DIAGNOSIS — M0579 Rheumatoid arthritis with rheumatoid factor of multiple sites without organ or systems involvement: Secondary | ICD-10-CM

## 2016-10-07 DIAGNOSIS — M19042 Primary osteoarthritis, left hand: Secondary | ICD-10-CM

## 2016-10-07 DIAGNOSIS — M19072 Primary osteoarthritis, left ankle and foot: Secondary | ICD-10-CM

## 2016-10-07 DIAGNOSIS — I7300 Raynaud's syndrome without gangrene: Secondary | ICD-10-CM

## 2016-10-07 DIAGNOSIS — M19041 Primary osteoarthritis, right hand: Secondary | ICD-10-CM | POA: Diagnosis not present

## 2016-10-07 DIAGNOSIS — M19071 Primary osteoarthritis, right ankle and foot: Secondary | ICD-10-CM

## 2016-10-07 LAB — CBC WITH DIFFERENTIAL/PLATELET
BASOS PCT: 0 %
Basophils Absolute: 0 cells/uL (ref 0–200)
EOS ABS: 65 {cells}/uL (ref 15–500)
Eosinophils Relative: 1 %
HEMATOCRIT: 44.8 % (ref 35.0–45.0)
HEMOGLOBIN: 15.3 g/dL (ref 11.7–15.5)
LYMPHS ABS: 1885 {cells}/uL (ref 850–3900)
Lymphocytes Relative: 29 %
MCH: 32.1 pg (ref 27.0–33.0)
MCHC: 34.2 g/dL (ref 32.0–36.0)
MCV: 94.1 fL (ref 80.0–100.0)
MONO ABS: 390 {cells}/uL (ref 200–950)
MPV: 10 fL (ref 7.5–12.5)
Monocytes Relative: 6 %
NEUTROS ABS: 4160 {cells}/uL (ref 1500–7800)
Neutrophils Relative %: 64 %
Platelets: 214 10*3/uL (ref 140–400)
RBC: 4.76 MIL/uL (ref 3.80–5.10)
RDW: 13.5 % (ref 11.0–15.0)
WBC: 6.5 10*3/uL (ref 3.8–10.8)

## 2016-10-07 NOTE — Progress Notes (Signed)
Rheumatology Medication Review by a Pharmacist Does the patient feel that his/her medications are working for him/her?  Yes, however she has had three small flares since her last visit all involving a single joint Has the patient been experiencing any side effects to the medications prescribed?  No, she reports shedding more hair but it is tolerable Does the patient have any problems obtaining medications?  No  Issues to address at subsequent visits: None   Pharmacist comments:  Eeva is a pleasant 31 yo F who presents for follow up of rheumatoid arthritis.  She is currently taking methotrexate 8 tablets weekly, folic acid 2 mg daily, and hydroxychloroquine 200 mg BID Monday through Friday.  Most recent standing labs were normal on 06/16/16.  Patient is due for standing labs today.  Reminded patient of importance of standing labs every 3 months.  Patient brought a copy of hydroxychloroquine eye exam results from 09/29/16.  "No toxicity."  Patient denies any questions or concerns regarding her medications at this time.    Elisabeth Most, Pharm.D., BCPS, CPP Clinical Pharmacist Pager: [phone] Phone: [phone] 10/07/2016 8:37 AM'

## 2016-10-07 NOTE — Patient Instructions (Signed)
Standing Labs We placed an order today for your standing lab work.    Please come back and get your standing labs in November 2018 and every 3 months.  We have open lab Monday through Friday from 8:30-11:30 AM and 1:30-4 PM at the office of Dr. Shaili Deveshwar.   The office is located at [address], [address], [place], [place] [address] No appointment is necessary.   Labs are drawn by [facility].  You may receive a bill from [facility] for your lab work. If you have any questions regarding directions or hours of operation,  please call [phone].     

## 2016-10-08 LAB — COMPLETE METABOLIC PANEL WITH GFR
ALBUMIN: 4 g/dL (ref 3.6–5.1)
ALT: 16 U/L (ref 6–29)
AST: 18 U/L (ref 10–30)
Alkaline Phosphatase: 42 U/L (ref 33–115)
BILIRUBIN TOTAL: 0.4 mg/dL (ref 0.2–1.2)
BUN: 13 mg/dL (ref 7–25)
CALCIUM: 8.6 mg/dL (ref 8.6–10.2)
CO2: 22 mmol/L (ref 20–32)
CREATININE: 1.05 mg/dL (ref 0.50–1.10)
Chloride: 106 mmol/L (ref 98–110)
GFR, EST AFRICAN AMERICAN: 82 mL/min (ref >=60)
GFR, Est Non African American: 71 mL/min (ref >=60)
Glucose, Bld: 85 mg/dL (ref 65–99)
Potassium: 4.6 mmol/L (ref 3.5–5.3)
Sodium: 139 mmol/L (ref 135–146)
TOTAL PROTEIN: 6.5 g/dL (ref 6.1–8.1)

## 2016-10-08 NOTE — Progress Notes (Signed)
WNL

## 2016-11-30 ENCOUNTER — Other Ambulatory Visit: Payer: Self-pay | Admitting: Rheumatology

## 2016-12-01 NOTE — Telephone Encounter (Signed)
Last Visit: 10/07/16 Next Visit: 03/12/17 Labs: 10/07/16 WNL  Okay to refill per Dr. Estanislado Pandy

## 2017-01-07 ENCOUNTER — Other Ambulatory Visit: Payer: Self-pay

## 2017-01-07 DIAGNOSIS — Z79899 Other long term (current) drug therapy: Secondary | ICD-10-CM

## 2017-01-07 LAB — CBC WITH DIFFERENTIAL/PLATELET
BASOS ABS: 7 {cells}/uL (ref 0–200)
Basophils Relative: 0.1 %
EOS ABS: 83 {cells}/uL (ref 15–500)
Eosinophils Relative: 1.2 %
HCT: 43.9 % (ref 35.0–45.0)
HEMOGLOBIN: 15 g/dL (ref 11.7–15.5)
Lymphs Abs: 2884 cells/uL (ref 850–3900)
MCH: 31.3 pg (ref 27.0–33.0)
MCHC: 34.2 g/dL (ref 32.0–36.0)
MCV: 91.6 fL (ref 80.0–100.0)
MONOS PCT: 6.3 %
MPV: 10 fL (ref 7.5–12.5)
Neutro Abs: 3491 cells/uL (ref 1500–7800)
Neutrophils Relative %: 50.6 %
PLATELETS: 223 10*3/uL (ref 140–400)
RBC: 4.79 10*6/uL (ref 3.80–5.10)
RDW: 12.4 % (ref 11.0–15.0)
TOTAL LYMPHOCYTE: 41.8 %
WBC mixed population: 435 cells/uL (ref 200–950)
WBC: 6.9 10*3/uL (ref 3.8–10.8)

## 2017-01-07 LAB — COMPLETE METABOLIC PANEL WITH GFR
AG RATIO: 1.7 (calc) (ref 1.0–2.5)
ALBUMIN MSPROF: 4.2 g/dL (ref 3.6–5.1)
ALT: 11 U/L (ref 6–29)
AST: 17 U/L (ref 10–30)
Alkaline phosphatase (APISO): 48 U/L (ref 33–115)
BUN: 7 mg/dL (ref 7–25)
CALCIUM: 8.9 mg/dL (ref 8.6–10.2)
CO2: 27 mmol/L (ref 20–32)
Chloride: 105 mmol/L (ref 98–110)
Creat: 0.91 mg/dL (ref 0.50–1.10)
GFR, EST AFRICAN AMERICAN: 98 mL/min/{1.73_m2} (ref >=60)
GFR, EST NON AFRICAN AMERICAN: 85 mL/min/{1.73_m2} (ref >=60)
Globulin: 2.5 g/dL (calc) (ref 1.9–3.7)
Glucose, Bld: 83 mg/dL (ref 65–99)
POTASSIUM: 4.3 mmol/L (ref 3.5–5.3)
SODIUM: 139 mmol/L (ref 135–146)
Total Bilirubin: 0.4 mg/dL (ref 0.2–1.2)
Total Protein: 6.7 g/dL (ref 6.1–8.1)

## 2017-01-08 NOTE — Progress Notes (Signed)
WNL

## 2017-01-27 ENCOUNTER — Other Ambulatory Visit: Payer: Self-pay | Admitting: Rheumatology

## 2017-01-27 NOTE — Telephone Encounter (Addendum)
Last Visit: 10/07/16 Next Visit: 03/12/17 Labs: 01/07/17 WNL PLQ eye exam 09/29/16 WNL  Okay to refill per Dr. Estanislado Pandy

## 2017-02-26 NOTE — Progress Notes (Signed)
Office Visit Note  Patient: Ruth Wells             Date of Birth: 1985/11/01           MRN: [id number]             PCP: Paula Compton, MD Referring: Paula Compton, MD Visit Date: 03/12/2017 Occupation: @GUAROCC @    Subjective:  Right 3rd finger pain   History of Present Illness: Ruth Wells is a 32 y.o. female with history of seropositive rheumatoid arthritis.  She states she continues to take MTX 8 tablets weekly, folic acid 2 mg, PLQ 683 mg BID M-F.  She states that she woke up this morning with discomfort and redness of her right 3rd MCP.  She states that the pain is minimal.  She has not been overusing her hands lately.  Sometimes she will have a flare if she is lifting heavy objects at her job at an [employer].  She denies any joint swelling.  She states she has no pain in her feet.  Her Raynaud's has improved since wearing gloves regularly.  She denies any digital ulcers.       Activities of Daily Living:  Patient reports morning stiffness for 5-10 minutes.   Patient Denies nocturnal pain.  Difficulty dressing/grooming: Denies Difficulty climbing stairs: Denies Difficulty getting out of chair: Denies Difficulty using hands for taps, buttons, cutlery, and/or writing: Denies   Review of Systems  Constitutional: Negative for fatigue and weakness.  HENT: Negative for mouth sores, mouth dryness and nose dryness.   Eyes: Negative for redness, visual disturbance and dryness.  Respiratory: Negative for cough and shortness of breath.   Cardiovascular: Negative for chest pain, palpitations, hypertension and swelling in legs/feet.  Gastrointestinal: Negative for blood in stool, constipation and diarrhea.  Endocrine: Negative for increased urination.  Genitourinary: Negative for painful urination.  Musculoskeletal: Positive for arthralgias, joint pain, joint swelling and morning stiffness. Negative for myalgias, muscle weakness, muscle tenderness and myalgias.    Skin: Negative for color change, pallor, rash, hair loss, nodules/bumps, redness, skin tightness, ulcers and sensitivity to sunlight.  Neurological: Positive for headaches. Negative for dizziness and numbness.  Hematological: Negative for swollen glands.  Psychiatric/Behavioral: Negative for depressed mood and sleep disturbance. The patient is not nervous/anxious.     PMFS History:  Patient Active Problem List   Diagnosis Date Noted  . Primary osteoarthritis of both hands 02/24/2016  . Primary osteoarthritis of both feet 02/24/2016  . High risk medication use 02/15/2016  . Raynaud's syndrome without gangrene 02/15/2016  . Rheumatoid arthritis with rheumatoid factor of multiple sites without organ or systems involvement ([place]) 02/15/2016    Past Medical History:  Diagnosis Date  . Arthritis     Family History  Problem Relation Age of Onset  . Arthritis Mother    Past Surgical History:  Procedure Laterality Date  . WISDOM TOOTH EXTRACTION     Social History   Social History Narrative  . Not on file     Objective: Vital Signs: BP (!) 150/98 (BP Location: Left Arm, Patient Position: Sitting, Cuff Size: Normal)   Pulse (!) 102   Resp 15   Ht 5\' 5"  (1.651 m)   Wt 167 lb (75.8 kg)   BMI 27.79 kg/m    Physical Exam  Constitutional: She is oriented to person, place, and time. She appears well-developed and well-nourished.  HENT:  Head: Normocephalic and atraumatic.  Eyes: Conjunctivae and EOM are normal.  Neck:  Normal range of motion.  Cardiovascular: Normal rate, regular rhythm, normal heart sounds and intact distal pulses.  Pulmonary/Chest: Effort normal and breath sounds normal.  Abdominal: Soft. Bowel sounds are normal.  Lymphadenopathy:    She has no cervical adenopathy.  Neurological: She is alert and oriented to person, place, and time.  Skin: Skin is warm and dry. Capillary refill takes less than 2 seconds.  Psychiatric: She has a normal mood and affect. Her  behavior is normal.  Nursing note and vitals reviewed.    Musculoskeletal Exam: C-spine, thoracic, lumbar good ROM.  Shoulder joints and elbow joints good ROM.  Right wrist slightly limited extension. Left wrist full ROM.  MCPs, PIPs, and DIPs good ROM with no synovitis.  She has mild erythema and tenderness of her right 3rd MCP.  Hip joints, knee joints, ankle joints, MTPs, PIPs, and DIPs good ROM with no synovitis.  No midline spinal tenderness or SI joint tenderness.  No trochanteric bursitis bilaterally.    CDAI Exam: CDAI Homunculus Exam:   Tenderness:  Right hand: 3rd MCP  Joint Counts:  CDAI Tender Joint count: 1 CDAI Swollen Joint count: 0  Global Assessments:  Patient Global Assessment: 2 Provider Global Assessment: 2  CDAI Calculated Score: 5    Investigation: No additional findings.PLQ eye exam: 10/09/2016 CBC Latest Ref Rng & Units 01/07/2017 10/07/2016 06/16/2016  WBC 3.8 - 10.8 Thousand/uL 6.9 6.5 4.8  Hemoglobin 11.7 - 15.5 g/dL 15.0 15.3 14.0  Hematocrit 35.0 - 45.0 % 43.9 44.8 42.6  Platelets 140 - 400 Thousand/uL 223 214 209   CMP Latest Ref Rng & Units 01/07/2017 10/07/2016 06/16/2016  Glucose 65 - 99 mg/dL 83 85 89  BUN 7 - 25 mg/dL 7 13 11   Creatinine 0.50 - 1.10 mg/dL 0.91 1.05 0.85  Sodium 135 - 146 mmol/L 139 139 138  Potassium 3.5 - 5.3 mmol/L 4.3 4.6 4.4  Chloride 98 - 110 mmol/L 105 106 107  CO2 20 - 32 mmol/L 27 22 23   Calcium 8.6 - 10.2 mg/dL 8.9 8.6 8.8  Total Protein 6.1 - 8.1 g/dL 6.7 6.5 6.6  Total Bilirubin 0.2 - 1.2 mg/dL 0.4 0.4 0.4  Alkaline Phos 33 - 115 U/L - 42 42  AST 10 - 30 U/L 17 18 15   ALT 6 - 29 U/L 11 16 15     Imaging: No results found.  Speciality Comments: No specialty comments available.    Procedures:  No procedures performed Allergies: Patient has no known allergies.   Assessment / Plan:     Visit Diagnoses: Rheumatoid arthritis with rheumatoid factor of multiple sites without organ or systems involvement ([place])  - Positive RF, negative anti-CCP erosive disease, history of JRA: She has no synovitis on exam.  She has erythema and tenderness of right 3rd MCP.  She will not require Prednisone at this time.  She will continue on Methotrexate 8 tablets weekly, folic acid 2 mg daily, and Plaquenil 200 mg BID M-F.   High risk medication use - Methotrexate 8 tablets by mouth every week, folic acid 2 mg by mouth daily, and Plaquenil 200 mg by mouth twice a day Monday through Friday-eye exam: 10/09/16.  Labs were drawn on 01/07/17.  Standing lab orders are in place for February and every 3 months.    Raynaud's syndrome without gangrene: Improved.  She wears gloves regularly.  She has no evidence of digital ulcers.    Primary osteoarthritis of both hands: No tenderness of DIP or PIP joints.  Primary osteoarthritis of both feet: No discomfort at this time.  She wears proper fitting shoes.     Orders: No orders of the defined types were placed in this encounter.  No orders of the defined types were placed in this encounter.   Follow-Up Instructions: Return in about 5 months (around 08/10/2017) for Rheumatoid arthritis.   Bo Merino, MD  Note - This record has been created using Editor, commissioning.  Chart creation errors have been sought, but may not always  have been located. Such creation errors do not reflect on  the standard of medical care.

## 2017-03-03 ENCOUNTER — Other Ambulatory Visit: Payer: Self-pay | Admitting: Rheumatology

## 2017-03-04 NOTE — Telephone Encounter (Signed)
Last Visit: 10/07/16 Next Visit: 03/12/17 Labs: 01/07/17 WNL  Okay to refill per Dr. Rob Hickman

## 2017-03-12 ENCOUNTER — Encounter: Payer: Self-pay | Admitting: Rheumatology

## 2017-03-12 ENCOUNTER — Ambulatory Visit: Payer: BC Managed Care – PPO | Admitting: Rheumatology

## 2017-03-12 VITALS — BP 150/98 | HR 102 | Resp 15 | Ht 65.0 in | Wt 167.0 lb

## 2017-03-12 DIAGNOSIS — I7300 Raynaud's syndrome without gangrene: Secondary | ICD-10-CM | POA: Diagnosis not present

## 2017-03-12 DIAGNOSIS — M19041 Primary osteoarthritis, right hand: Secondary | ICD-10-CM | POA: Diagnosis not present

## 2017-03-12 DIAGNOSIS — M19071 Primary osteoarthritis, right ankle and foot: Secondary | ICD-10-CM | POA: Diagnosis not present

## 2017-03-12 DIAGNOSIS — M19042 Primary osteoarthritis, left hand: Secondary | ICD-10-CM

## 2017-03-12 DIAGNOSIS — M0579 Rheumatoid arthritis with rheumatoid factor of multiple sites without organ or systems involvement: Secondary | ICD-10-CM

## 2017-03-12 DIAGNOSIS — Z79899 Other long term (current) drug therapy: Secondary | ICD-10-CM | POA: Diagnosis not present

## 2017-03-12 DIAGNOSIS — M19072 Primary osteoarthritis, left ankle and foot: Secondary | ICD-10-CM | POA: Diagnosis not present

## 2017-03-12 NOTE — Patient Instructions (Signed)
Standing Labs We placed an order today for your standing lab work.    Please come back and get your standing labs in February and every 3 months  We have open lab Monday through Friday from 8:30-11:30 AM and 1:30-4 PM at the office of Dr. Shaili Deveshwar.   The office is located at [address], [address], [place], [place] [address] No appointment is necessary.   Labs are drawn by [facility].  You may receive a bill from [facility] for your lab work. If you have any questions regarding directions or hours of operation,  please call [phone].    

## 2017-05-07 ENCOUNTER — Telehealth: Payer: Self-pay | Admitting: Rheumatology

## 2017-05-07 NOTE — Telephone Encounter (Signed)
Ulice Brilliant, pharmacist at [facility] called for refill order of Folic Acid  1 mg, 2 tab/day.  Please fax prescription to [phone]

## 2017-05-08 MED ORDER — FOLIC ACID 1 MG PO TABS: 2.0000 mg | ORAL_TABLET | Freq: Every day | ORAL | 4 refills | Status: DC

## 2017-05-08 NOTE — Telephone Encounter (Signed)
Last Visit: 03/12/17 Next Visit: 08/12/17  Okay to refill per Dr. Estanislado Pandy

## 2017-06-01 ENCOUNTER — Other Ambulatory Visit: Payer: Self-pay

## 2017-06-01 DIAGNOSIS — Z79899 Other long term (current) drug therapy: Secondary | ICD-10-CM

## 2017-06-01 LAB — COMPLETE METABOLIC PANEL WITH GFR
AG Ratio: 2.2 (calc) (ref 1.0–2.5)
ALBUMIN MSPROF: 4.3 g/dL (ref 3.6–5.1)
ALKALINE PHOSPHATASE (APISO): 49 U/L (ref 33–115)
ALT: 14 U/L (ref 6–29)
AST: 21 U/L (ref 10–30)
BUN: 11 mg/dL (ref 7–25)
CHLORIDE: 106 mmol/L (ref 98–110)
CO2: 29 mmol/L (ref 20–32)
Calcium: 8.9 mg/dL (ref 8.6–10.2)
Creat: 0.9 mg/dL (ref 0.50–1.10)
GFR, Est African American: 99 mL/min/{1.73_m2} (ref >=60)
GFR, Est Non African American: 86 mL/min/{1.73_m2} (ref >=60)
GLUCOSE: 69 mg/dL (ref 65–99)
Globulin: 2 g/dL (calc) (ref 1.9–3.7)
Potassium: 4.3 mmol/L (ref 3.5–5.3)
Sodium: 141 mmol/L (ref 135–146)
Total Bilirubin: 0.5 mg/dL (ref 0.2–1.2)
Total Protein: 6.3 g/dL (ref 6.1–8.1)

## 2017-06-01 LAB — CBC WITH DIFFERENTIAL/PLATELET
BASOS PCT: 0.2 %
Basophils Absolute: 11 cells/uL (ref 0–200)
EOS PCT: 0.7 %
Eosinophils Absolute: 40 cells/uL (ref 15–500)
HEMATOCRIT: 44.2 % (ref 35.0–45.0)
Hemoglobin: 15.1 g/dL (ref 11.7–15.5)
LYMPHS ABS: 2759 {cells}/uL (ref 850–3900)
MCH: 31.5 pg (ref 27.0–33.0)
MCHC: 34.2 g/dL (ref 32.0–36.0)
MCV: 92.3 fL (ref 80.0–100.0)
MPV: 10.3 fL (ref 7.5–12.5)
Monocytes Relative: 5.1 %
Neutro Abs: 2599 cells/uL (ref 1500–7800)
Neutrophils Relative %: 45.6 %
Platelets: 218 10*3/uL (ref 140–400)
RBC: 4.79 10*6/uL (ref 3.80–5.10)
RDW: 12.6 % (ref 11.0–15.0)
Total Lymphocyte: 48.4 %
WBC: 5.7 10*3/uL (ref 3.8–10.8)
WBCMIX: 291 {cells}/uL (ref 200–950)

## 2017-06-09 ENCOUNTER — Other Ambulatory Visit: Payer: Self-pay | Admitting: *Deleted

## 2017-06-09 MED ORDER — METHOTREXATE 2.5 MG PO TABS: ORAL_TABLET | ORAL | 2 refills | Status: DC

## 2017-06-09 NOTE — Telephone Encounter (Signed)
Refill request received via fax  Last Visit: 03/12/17 Next Visit: 08/12/17 Labs: 06/01/17 WNL  Okay to refill per Dr. Estanislado Pandy

## 2017-07-29 NOTE — Progress Notes (Signed)
Office Visit Note  Patient: Ruth Wells             Date of Birth: 1985/07/20           MRN: [id number]             PCP: Haywood Pao, MD Referring: Paula Compton, MD Visit Date: 08/12/2017 Occupation: @GUAROCC @    Subjective:  Medication management.   History of Present Illness: Ruth Wells is a 32 y.o. female with history of seropositive rheumatoid arthritis.  She states she has had 2 flares since the last visit.  The last flare was a week ago which was in the right second and third MCP joint.  She states this lasted only 1 day and resolved.  She is currently on methotrexate 8 tablets/week and Plaquenil 200 mg twice daily.  Activities of Daily Living:  Patient reports morning stiffness for 0 only with flares.   Patient Denies nocturnal pain.  Difficulty dressing/grooming: Denies Difficulty climbing stairs: Denies Difficulty getting out of chair: Denies Difficulty using hands for taps, buttons, cutlery, and/or writing: Denies   Review of Systems  Constitutional: Positive for fatigue. Negative for night sweats, weight gain and weight loss.  HENT: Negative for mouth sores, trouble swallowing, trouble swallowing, mouth dryness and nose dryness.   Eyes: Negative for pain, redness, visual disturbance and dryness.  Respiratory: Negative for cough, shortness of breath and difficulty breathing.   Cardiovascular: Negative for chest pain, palpitations, hypertension, irregular heartbeat and swelling in legs/feet.  Gastrointestinal: Negative for blood in stool, constipation and diarrhea.  Endocrine: Negative for excessive thirst and increased urination.  Genitourinary: Negative for difficulty urinating and vaginal dryness.  Musculoskeletal: Negative for arthralgias, joint pain, joint swelling, myalgias, muscle weakness, morning stiffness, muscle tenderness and myalgias.  Skin: Negative for color change, rash, hair loss, skin tightness, ulcers and sensitivity to  sunlight.  Allergic/Immunologic: Negative for susceptible to infections.  Neurological: Negative for dizziness, memory loss, night sweats and weakness.  Hematological: Negative for bruising/bleeding tendency and swollen glands.  Psychiatric/Behavioral: Negative for depressed mood and sleep disturbance. The patient is not nervous/anxious.     PMFS History:  Patient Active Problem List   Diagnosis Date Noted  . Primary osteoarthritis of both hands 02/24/2016  . Primary osteoarthritis of both feet 02/24/2016  . High risk medication use 02/15/2016  . Raynaud's syndrome without gangrene 02/15/2016  . Rheumatoid arthritis with rheumatoid factor of multiple sites without organ or systems involvement ([place]) 02/15/2016    Past Medical History:  Diagnosis Date  . Arthritis   . Hypertension     Family History  Problem Relation Age of Onset  . Arthritis Mother    Past Surgical History:  Procedure Laterality Date  . WISDOM TOOTH EXTRACTION     Social History   Social History Narrative  . Not on file     Objective: Vital Signs: BP 137/80 (BP Location: Left Arm, Patient Position: Sitting, Cuff Size: Normal)   Pulse 87   Resp 14   Ht 5\' 5"  (1.651 m)   Wt 161 lb (73 kg)   LMP 08/02/2017   BMI 26.79 kg/m    Physical Exam  Constitutional: She is oriented to person, place, and time. She appears well-developed and well-nourished.  HENT:  Head: Normocephalic and atraumatic.  Eyes: Conjunctivae and EOM are normal.  Neck: Normal range of motion.  Cardiovascular: Normal rate, regular rhythm, normal heart sounds and intact distal pulses.  Pulmonary/Chest: Effort normal and breath sounds  normal.  Abdominal: Soft. Bowel sounds are normal.  Lymphadenopathy:    She has no cervical adenopathy.  Neurological: She is alert and oriented to person, place, and time.  Skin: Skin is warm and dry. Capillary refill takes less than 2 seconds.  Psychiatric: She has a normal mood and affect. Her  behavior is normal.  Nursing note and vitals reviewed.    Musculoskeletal Exam: Spine thoracic lumbar spine good range of motion.  Shoulder joints elbow joints wrist joint MCPs PIPs DIPs been good range of motion.  She has synovial thickening over bilateral second and third MCP joints.  Hip joints knee joints ankles MTPs PIPs were in good range of motion.  CDAI Exam: CDAI Homunculus Exam:   Joint Counts:  CDAI Tender Joint count: 0 CDAI Swollen Joint count: 0  Global Assessments:  Patient Global Assessment: 1 Provider Global Assessment: 4  CDAI Calculated Score: 5    Investigation: No additional findings.PLQ eye exam: 10/09/2016 CBC Latest Ref Rng & Units 06/01/2017 01/07/2017 10/07/2016  WBC 3.8 - 10.8 Thousand/uL 5.7 6.9 6.5  Hemoglobin 11.7 - 15.5 g/dL 15.1 15.0 15.3  Hematocrit 35.0 - 45.0 % 44.2 43.9 44.8  Platelets 140 - 400 Thousand/uL 218 223 214   CMP Latest Ref Rng & Units 06/01/2017 01/07/2017 10/07/2016  Glucose 65 - 99 mg/dL 69 83 85  BUN 7 - 25 mg/dL 11 7 13   Creatinine 0.50 - 1.10 mg/dL 0.90 0.91 1.05  Sodium 135 - 146 mmol/L 141 139 139  Potassium 3.5 - 5.3 mmol/L 4.3 4.3 4.6  Chloride 98 - 110 mmol/L 106 105 106  CO2 20 - 32 mmol/L 29 27 22   Calcium 8.6 - 10.2 mg/dL 8.9 8.9 8.6  Total Protein 6.1 - 8.1 g/dL 6.3 6.7 6.5  Total Bilirubin 0.2 - 1.2 mg/dL 0.5 0.4 0.4  Alkaline Phos 33 - 115 U/L - - 42  AST 10 - 30 U/L 21 17 18   ALT 6 - 29 U/L 14 11 16     Imaging: No results found.  Speciality Comments: No specialty comments available.    Procedures:  No procedures performed Allergies: Patient has no known allergies.   Assessment / Plan:     Visit Diagnoses: Rheumatoid arthritis with rheumatoid factor of multiple sites without organ or systems involvement ([place]) - Positive RF, negative anti-CCP erosive disease, history of JRA.  Patient has had 2 flares since the last visit.  Although they did not last very long.  Flares had been in MCP joints.  Currently  she is doing well.  I detailed discussion with patient regarding Biologics but she declined.  She does not want to take any injectable medications.  She does not want to switch to subcu methotrexate.  We discussed increasing methotrexate to 10 tablets p.o. weekly.  She was in agreement with that.  She will split her dose to 5 tablets twice a week.  We will check labs in 1 month and then every 3 months to monitor for drug toxicity.  She will continue Plaquenil as prescribed.  I discussed the option of possible subcu Cimzia for an office administration in case the methotrexate fails.  High risk medication use - Methotrexate 8 tablets by mouth every week, folic acid 2 mg by mouth daily, and Plaquenil 200 mg by mouth twice a day Monday through Friday. eye exam: 10/09/2016  Raynaud's syndrome without gangrene-currently not symptomatic.  Primary osteoarthritis of both hands-she has mild changes but no discomfort.  Primary osteoarthritis of both feet-currently  not having any discomfort.   Orders: No orders of the defined types were placed in this encounter.  Meds ordered this encounter  Medications  . methotrexate (RHEUMATREX) 2.5 MG tablet    Sig: Take 5 tablets po twice a week    Dispense:  40 tablet    Refill:  2      Follow-Up Instructions: Return in about 4 months (around 12/12/2017) for Rheumatoid arthritis.   Bo Merino, MD  Note - This record has been created using Editor, commissioning.  Chart creation errors have been sought, but may not always  have been located. Such creation errors do not reflect on  the standard of medical care.

## 2017-07-30 ENCOUNTER — Telehealth: Payer: Self-pay | Admitting: Rheumatology

## 2017-07-30 MED ORDER — HYDROXYCHLOROQUINE SULFATE 200 MG PO TABS: ORAL_TABLET | ORAL | 1 refills | Status: DC

## 2017-07-30 NOTE — Telephone Encounter (Signed)
Last Visit: 03/12/17 Next Visit: 08/12/17 Labs: 06/01/17 WNL  PLQ eye exam: 10/09/2016 WNL  Okay to refill per Dr. Estanislado Pandy

## 2017-07-30 NOTE — Telephone Encounter (Signed)
Patient called requesting prescription refill of Hydroxychloroquine to be sent to [facility] at [address]   °

## 2017-08-12 ENCOUNTER — Ambulatory Visit: Payer: BC Managed Care – PPO | Admitting: Rheumatology

## 2017-08-12 ENCOUNTER — Encounter: Payer: Self-pay | Admitting: Rheumatology

## 2017-08-12 VITALS — BP 137/80 | HR 87 | Resp 14 | Ht 65.0 in | Wt 161.0 lb

## 2017-08-12 DIAGNOSIS — I7300 Raynaud's syndrome without gangrene: Secondary | ICD-10-CM | POA: Diagnosis not present

## 2017-08-12 DIAGNOSIS — M19041 Primary osteoarthritis, right hand: Secondary | ICD-10-CM

## 2017-08-12 DIAGNOSIS — M19072 Primary osteoarthritis, left ankle and foot: Secondary | ICD-10-CM | POA: Diagnosis not present

## 2017-08-12 DIAGNOSIS — Z79899 Other long term (current) drug therapy: Secondary | ICD-10-CM | POA: Diagnosis not present

## 2017-08-12 DIAGNOSIS — M0579 Rheumatoid arthritis with rheumatoid factor of multiple sites without organ or systems involvement: Secondary | ICD-10-CM | POA: Diagnosis not present

## 2017-08-12 DIAGNOSIS — M19071 Primary osteoarthritis, right ankle and foot: Secondary | ICD-10-CM

## 2017-08-12 DIAGNOSIS — M19042 Primary osteoarthritis, left hand: Secondary | ICD-10-CM

## 2017-08-12 MED ORDER — METHOTREXATE 2.5 MG PO TABS: ORAL_TABLET | ORAL | 2 refills | Status: DC

## 2017-09-17 ENCOUNTER — Other Ambulatory Visit: Payer: Self-pay

## 2017-09-17 DIAGNOSIS — Z79899 Other long term (current) drug therapy: Secondary | ICD-10-CM

## 2017-09-17 LAB — CBC WITH DIFFERENTIAL/PLATELET
BASOS ABS: 13 {cells}/uL (ref 0–200)
Basophils Relative: 0.2 %
Eosinophils Absolute: 38 cells/uL (ref 15–500)
Eosinophils Relative: 0.6 %
HCT: 38.5 % (ref 35.0–45.0)
Hemoglobin: 13.3 g/dL (ref 11.7–15.5)
Lymphs Abs: 2893 cells/uL (ref 850–3900)
MCH: 32.3 pg (ref 27.0–33.0)
MCHC: 34.5 g/dL (ref 32.0–36.0)
MCV: 93.4 fL (ref 80.0–100.0)
MONOS PCT: 6.6 %
MPV: 9.7 fL (ref 7.5–12.5)
Neutro Abs: 3034 cells/uL (ref 1500–7800)
Neutrophils Relative %: 47.4 %
Platelets: 246 10*3/uL (ref 140–400)
RBC: 4.12 10*6/uL (ref 3.80–5.10)
RDW: 13.9 % (ref 11.0–15.0)
TOTAL LYMPHOCYTE: 45.2 %
WBC mixed population: 422 cells/uL (ref 200–950)
WBC: 6.4 10*3/uL (ref 3.8–10.8)

## 2017-09-17 LAB — COMPLETE METABOLIC PANEL WITH GFR
AG RATIO: 2 (calc) (ref 1.0–2.5)
ALKALINE PHOSPHATASE (APISO): 46 U/L (ref 33–115)
ALT: 16 U/L (ref 6–29)
AST: 16 U/L (ref 10–30)
Albumin: 4.3 g/dL (ref 3.6–5.1)
BUN: 12 mg/dL (ref 7–25)
CO2: 26 mmol/L (ref 20–32)
Calcium: 8.6 mg/dL (ref 8.6–10.2)
Chloride: 106 mmol/L (ref 98–110)
Creat: 0.85 mg/dL (ref 0.50–1.10)
GFR, EST AFRICAN AMERICAN: 106 mL/min/{1.73_m2} (ref >=60)
GFR, EST NON AFRICAN AMERICAN: 91 mL/min/{1.73_m2} (ref >=60)
GLOBULIN: 2.2 g/dL (ref 1.9–3.7)
Glucose, Bld: 72 mg/dL (ref 65–99)
POTASSIUM: 4 mmol/L (ref 3.5–5.3)
SODIUM: 139 mmol/L (ref 135–146)
Total Bilirubin: 0.4 mg/dL (ref 0.2–1.2)
Total Protein: 6.5 g/dL (ref 6.1–8.1)

## 2017-11-14 ENCOUNTER — Other Ambulatory Visit: Payer: Self-pay | Admitting: Rheumatology

## 2017-11-16 NOTE — Telephone Encounter (Signed)
Last Visit: 08/12/17 Next Visit: 12/17/17 Labs: 09/17/17 WNL  Okay to refill per Dr. Estanislado Pandy

## 2017-12-03 NOTE — Progress Notes (Signed)
Office Visit Note  Patient: Ruth Wells             Date of Birth: 1985-09-24           MRN: [id number]             PCP: Haywood Pao, MD Referring: Haywood Pao, MD Visit Date: 12/17/2017 Occupation: @GUAROCC @  Subjective:  Medication management  History of Present Illness: Ruth Wells is a 32 y.o. female with history of seropositive rheumatoid arthritis, osteoarthritis, and Raynaud's.  Patient is on methotrexate 10 tablets by mouth divided twice weekly and folic acid 2 mg daily.  She is also taking PLQ 200 mg 1 tablet by mouth twice daily Monday through Friday.  Patient denies any joint pain or joint swelling.  Activities of Daily Living:  Patient reports morning stiffness for 1 minute.   Patient Denies nocturnal pain.  Difficulty dressing/grooming: Denies Difficulty climbing stairs: Denies Difficulty getting out of chair: Denies Difficulty using hands for taps, buttons, cutlery, and/or writing: Denies  Review of Systems  Constitutional: Positive for fatigue.  HENT: Negative for mouth sores, mouth dryness and nose dryness.   Eyes: Negative for pain, visual disturbance and dryness.  Respiratory: Negative for cough, hemoptysis, shortness of breath and difficulty breathing.   Cardiovascular: Negative for chest pain, palpitations, hypertension and swelling in legs/feet.  Gastrointestinal: Negative for blood in stool, constipation and diarrhea.  Endocrine: Negative for increased urination.  Genitourinary: Negative for difficulty urinating and painful urination.  Musculoskeletal: Positive for morning stiffness. Negative for arthralgias, joint pain, joint swelling, myalgias, muscle weakness, muscle tenderness and myalgias.  Skin: Negative for color change, pallor, rash, hair loss, nodules/bumps, skin tightness, ulcers and sensitivity to sunlight.  Allergic/Immunologic: Negative for susceptible to infections.  Neurological: Negative for dizziness, numbness,  headaches and weakness.  Hematological: Negative for bruising/bleeding tendency and swollen glands.  Psychiatric/Behavioral: Negative for depressed mood and sleep disturbance. The patient is not nervous/anxious.     PMFS History:  Patient Active Problem List   Diagnosis Date Noted  . Primary osteoarthritis of both hands 02/24/2016  . Primary osteoarthritis of both feet 02/24/2016  . High risk medication use 02/15/2016  . Raynaud's syndrome without gangrene 02/15/2016  . Rheumatoid arthritis with rheumatoid factor of multiple sites without organ or systems involvement ([place]) 02/15/2016    Past Medical History:  Diagnosis Date  . Arthritis   . Hypertension     Family History  Problem Relation Age of Onset  . Arthritis Mother    Past Surgical History:  Procedure Laterality Date  . WISDOM TOOTH EXTRACTION     Social History   Social History Narrative  . Not on file    Objective: Vital Signs: BP 122/74 (BP Location: Left Arm, Patient Position: Sitting, Cuff Size: Normal)   Pulse 87   Resp 14   Ht 5\' 5"  (1.651 m)   Wt 164 lb (74.4 kg)   LMP 12/17/2017   BMI 27.29 kg/m    Physical Exam  Constitutional: She is oriented to person, place, and time. She appears well-developed and well-nourished.  HENT:  Head: Normocephalic and atraumatic.  Eyes: Conjunctivae and EOM are normal.  Neck: Normal range of motion.  Cardiovascular: Normal rate, regular rhythm, normal heart sounds and intact distal pulses.  Pulmonary/Chest: Effort normal and breath sounds normal.  Abdominal: Soft. Bowel sounds are normal.  Lymphadenopathy:    She has no cervical adenopathy.  Neurological: She is alert and oriented to person, place, and  time.  Skin: Skin is warm and dry. Capillary refill takes less than 2 seconds.  Psychiatric: She has a normal mood and affect. Her behavior is normal.  Nursing note and vitals reviewed.    Musculoskeletal Exam: Spine thoracic lumbar spine good range of motion.   Shoulder joints elbow joints wrist joint MCPs PIPs DIPs were in good range of motion with no synovitis.  Hip joints knee joints ankles MTPs PIPs were in good range of motion with no synovitis.  CDAI Exam: CDAI Score: 0.2  Patient Global Assessment: 1 (mm); Provider Global Assessment: 1 (mm) Swollen: 0 ; Tender: 0  Joint Exam   Not documented   There is currently no information documented on the homunculus. Go to the Rheumatology activity and complete the homunculus joint exam.  Investigation: No additional findings.  Imaging: No results found.  Recent Labs: Lab Results  Component Value Date   WBC 6.4 09/17/2017   HGB 13.3 09/17/2017   PLT 246 09/17/2017   NA 139 09/17/2017   K 4.0 09/17/2017   CL 106 09/17/2017   CO2 26 09/17/2017   GLUCOSE 72 09/17/2017   BUN 12 09/17/2017   CREATININE 0.85 09/17/2017   BILITOT 0.4 09/17/2017   ALKPHOS 42 10/07/2016   AST 16 09/17/2017   ALT 16 09/17/2017   PROT 6.5 09/17/2017   ALBUMIN 4.0 10/07/2016   CALCIUM 8.6 09/17/2017   GFRAA 106 09/17/2017    Speciality Comments: PLQ Eye exam: 10/07/17 WNL At [facility]    Procedures:  No procedures performed Allergies: Patient has no known allergies.   Assessment / Plan:     Visit Diagnoses: Rheumatoid arthritis with rheumatoid factor of multiple sites without organ or systems involvement ([place]) - Positive RF, negative anti-CCP erosive disease, history of JRA.  Patient has no synovitis on examination.  She is doing much better with combination of methotrexate 10 tablets/week along with Plaquenil 200 mg twice daily Monday through Friday.  High risk medication use - Methotrexate 10  tablets by mouth every week, folic acid 2 mg by mouth daily, and Plaquenil 200 mg by mouth twice a day Monday through Friday.  - Plan: CBC with Differential/Platelet, COMPLETE METABOLIC PANEL WITH GFR today and then every 3 months to monitor for drug toxicity.  Raynaud's syndrome without  gangrene-currently not active.  Primary osteoarthritis of both hands-she has mild DIP changes which could be related to underlying JRA.  Primary osteoarthritis of both feet -she has some DIP thickening.  Orders: Orders Placed This Encounter  Procedures  . CBC with Differential/Platelet  . COMPLETE METABOLIC PANEL WITH GFR   No orders of the defined types were placed in this encounter.     Follow-Up Instructions: Return in about 5 months (around 05/18/2018) for Rheumatoid arthritis, Osteoarthritis, Raynaud's syndrome.   Bo Merino, MD  Note - This record has been created using Editor, commissioning.  Chart creation errors have been sought, but may not always  have been located. Such creation errors do not reflect on  the standard of medical care.

## 2017-12-17 ENCOUNTER — Encounter (INDEPENDENT_AMBULATORY_CARE_PROVIDER_SITE_OTHER): Payer: Self-pay

## 2017-12-17 ENCOUNTER — Ambulatory Visit: Payer: BC Managed Care – PPO | Admitting: Rheumatology

## 2017-12-17 ENCOUNTER — Encounter: Payer: Self-pay | Admitting: Physician Assistant

## 2017-12-17 VITALS — BP 122/74 | HR 87 | Resp 14 | Ht 65.0 in | Wt 164.0 lb

## 2017-12-17 DIAGNOSIS — M0579 Rheumatoid arthritis with rheumatoid factor of multiple sites without organ or systems involvement: Secondary | ICD-10-CM

## 2017-12-17 DIAGNOSIS — M19041 Primary osteoarthritis, right hand: Secondary | ICD-10-CM

## 2017-12-17 DIAGNOSIS — Z79899 Other long term (current) drug therapy: Secondary | ICD-10-CM

## 2017-12-17 DIAGNOSIS — M19072 Primary osteoarthritis, left ankle and foot: Secondary | ICD-10-CM

## 2017-12-17 DIAGNOSIS — I7300 Raynaud's syndrome without gangrene: Secondary | ICD-10-CM | POA: Diagnosis not present

## 2017-12-17 DIAGNOSIS — M19071 Primary osteoarthritis, right ankle and foot: Secondary | ICD-10-CM

## 2017-12-17 DIAGNOSIS — M19042 Primary osteoarthritis, left hand: Secondary | ICD-10-CM

## 2017-12-18 LAB — CBC WITH DIFFERENTIAL/PLATELET
BASOS ABS: 13 {cells}/uL (ref 0–200)
Basophils Relative: 0.2 %
EOS ABS: 39 {cells}/uL (ref 15–500)
Eosinophils Relative: 0.6 %
HEMATOCRIT: 41 % (ref 35.0–45.0)
HEMOGLOBIN: 14.2 g/dL (ref 11.7–15.5)
Lymphs Abs: 2470 cells/uL (ref 850–3900)
MCH: 33.7 pg — ABNORMAL HIGH (ref 27.0–33.0)
MCHC: 34.6 g/dL (ref 32.0–36.0)
MCV: 97.4 fL (ref 80.0–100.0)
MPV: 10.5 fL (ref 7.5–12.5)
Monocytes Relative: 6.6 %
NEUTROS ABS: 3549 {cells}/uL (ref 1500–7800)
NEUTROS PCT: 54.6 %
Platelets: 227 10*3/uL (ref 140–400)
RBC: 4.21 10*6/uL (ref 3.80–5.10)
RDW: 12.4 % (ref 11.0–15.0)
Total Lymphocyte: 38 %
WBC: 6.5 10*3/uL (ref 3.8–10.8)
WBCMIX: 429 {cells}/uL (ref 200–950)

## 2017-12-18 LAB — COMPLETE METABOLIC PANEL WITH GFR
AG Ratio: 1.9 (calc) (ref 1.0–2.5)
ALT: 18 U/L (ref 6–29)
AST: 16 U/L (ref 10–30)
Albumin: 4.2 g/dL (ref 3.6–5.1)
Alkaline phosphatase (APISO): 49 U/L (ref 33–115)
BILIRUBIN TOTAL: 0.5 mg/dL (ref 0.2–1.2)
BUN: 8 mg/dL (ref 7–25)
CALCIUM: 8.8 mg/dL (ref 8.6–10.2)
CHLORIDE: 107 mmol/L (ref 98–110)
CO2: 27 mmol/L (ref 20–32)
Creat: 0.94 mg/dL (ref 0.50–1.10)
GFR, EST NON AFRICAN AMERICAN: 81 mL/min/{1.73_m2} (ref >=60)
GFR, Est African American: 94 mL/min/{1.73_m2} (ref >=60)
GLUCOSE: 97 mg/dL (ref 65–99)
Globulin: 2.2 g/dL (calc) (ref 1.9–3.7)
POTASSIUM: 3.9 mmol/L (ref 3.5–5.3)
Sodium: 141 mmol/L (ref 135–146)
TOTAL PROTEIN: 6.4 g/dL (ref 6.1–8.1)

## 2017-12-18 NOTE — Progress Notes (Signed)
wnl

## 2018-01-10 ENCOUNTER — Other Ambulatory Visit: Payer: Self-pay | Admitting: Rheumatology

## 2018-01-11 NOTE — Telephone Encounter (Signed)
Last Visit: 12/17/17 Next Visit: 05/20/18 Labs: 12/17/17 WNL  Okay to refill per Dr. Estanislado Pandy

## 2018-02-20 ENCOUNTER — Other Ambulatory Visit: Payer: Self-pay | Admitting: Rheumatology

## 2018-02-22 NOTE — Telephone Encounter (Signed)
Last Visit: 12/17/17 Next Visit: 05/20/18 Labs: 12/17/17 WNL  Okay to refill per Dr. Estanislado Pandy

## 2018-03-29 ENCOUNTER — Other Ambulatory Visit: Payer: Self-pay

## 2018-03-29 DIAGNOSIS — Z79899 Other long term (current) drug therapy: Secondary | ICD-10-CM

## 2018-03-29 LAB — CBC WITH DIFFERENTIAL/PLATELET
Absolute Monocytes: 331 cells/uL (ref 200–950)
Basophils Absolute: 10 cells/uL (ref 0–200)
Basophils Relative: 0.2 %
EOS ABS: 82 {cells}/uL (ref 15–500)
Eosinophils Relative: 1.7 %
HCT: 41.6 % (ref 35.0–45.0)
Hemoglobin: 14.1 g/dL (ref 11.7–15.5)
Lymphs Abs: 1786 cells/uL (ref 850–3900)
MCH: 33.6 pg — ABNORMAL HIGH (ref 27.0–33.0)
MCHC: 33.9 g/dL (ref 32.0–36.0)
MCV: 99 fL (ref 80.0–100.0)
MPV: 10.2 fL (ref 7.5–12.5)
Monocytes Relative: 6.9 %
NEUTROS PCT: 54 %
Neutro Abs: 2592 cells/uL (ref 1500–7800)
Platelets: 215 10*3/uL (ref 140–400)
RBC: 4.2 10*6/uL (ref 3.80–5.10)
RDW: 13 % (ref 11.0–15.0)
Total Lymphocyte: 37.2 %
WBC: 4.8 10*3/uL (ref 3.8–10.8)

## 2018-03-29 LAB — COMPLETE METABOLIC PANEL WITH GFR
AG Ratio: 1.8 (calc) (ref 1.0–2.5)
ALBUMIN MSPROF: 3.9 g/dL (ref 3.6–5.1)
ALT: 19 U/L (ref 6–29)
AST: 17 U/L (ref 10–30)
Alkaline phosphatase (APISO): 43 U/L (ref 31–125)
BUN: 9 mg/dL (ref 7–25)
CALCIUM: 8.4 mg/dL — AB (ref 8.6–10.2)
CO2: 25 mmol/L (ref 20–32)
Chloride: 108 mmol/L (ref 98–110)
Creat: 0.86 mg/dL (ref 0.50–1.10)
GFR, Est African American: 104 mL/min/{1.73_m2} (ref >=60)
GFR, Est Non African American: 90 mL/min/{1.73_m2} (ref >=60)
Globulin: 2.2 g/dL (calc) (ref 1.9–3.7)
Glucose, Bld: 90 mg/dL (ref 65–99)
Potassium: 4.4 mmol/L (ref 3.5–5.3)
Sodium: 139 mmol/L (ref 135–146)
Total Bilirubin: 0.3 mg/dL (ref 0.2–1.2)
Total Protein: 6.1 g/dL (ref 6.1–8.1)

## 2018-04-25 ENCOUNTER — Other Ambulatory Visit: Payer: Self-pay | Admitting: Rheumatology

## 2018-04-26 NOTE — Telephone Encounter (Addendum)
Last Visit: 12/17/17 Next Visit: 05/20/18 Labs: 03/29/18 Calcium is low-8.4. MCH stable. Rest of CBC WNL.  PLQ Eye exam: 10/07/17 WNL   Okay to refill per Dr. Estanislado Pandy

## 2018-05-15 ENCOUNTER — Other Ambulatory Visit: Payer: Self-pay | Admitting: Rheumatology

## 2018-05-17 NOTE — Telephone Encounter (Signed)
Last Visit: 12/17/2017 Next Visit: 05/20/2018 Labs: 03/29/2018 Calcium is low-8.4. Please advise patient to increase calcium intake in diet. MCH stable. Rest of CBC WNL. We will continue to monitor lab work.  Okay to refill per Dr. Estanislado Pandy.

## 2018-05-19 ENCOUNTER — Ambulatory Visit (INDEPENDENT_AMBULATORY_CARE_PROVIDER_SITE_OTHER): Payer: Self-pay

## 2018-05-19 ENCOUNTER — Other Ambulatory Visit: Payer: Self-pay

## 2018-05-19 ENCOUNTER — Encounter: Payer: Self-pay | Admitting: Physician Assistant

## 2018-05-19 ENCOUNTER — Ambulatory Visit (INDEPENDENT_AMBULATORY_CARE_PROVIDER_SITE_OTHER): Payer: BC Managed Care – PPO

## 2018-05-19 ENCOUNTER — Ambulatory Visit: Payer: BC Managed Care – PPO | Admitting: Physician Assistant

## 2018-05-19 VITALS — BP 114/95 | HR 87 | Resp 12 | Ht 65.0 in | Wt 165.4 lb

## 2018-05-19 DIAGNOSIS — M0579 Rheumatoid arthritis with rheumatoid factor of multiple sites without organ or systems involvement: Secondary | ICD-10-CM

## 2018-05-19 DIAGNOSIS — Z79899 Other long term (current) drug therapy: Secondary | ICD-10-CM

## 2018-05-19 DIAGNOSIS — M19072 Primary osteoarthritis, left ankle and foot: Secondary | ICD-10-CM

## 2018-05-19 DIAGNOSIS — M19041 Primary osteoarthritis, right hand: Secondary | ICD-10-CM | POA: Diagnosis not present

## 2018-05-19 DIAGNOSIS — M19071 Primary osteoarthritis, right ankle and foot: Secondary | ICD-10-CM

## 2018-05-19 DIAGNOSIS — M19042 Primary osteoarthritis, left hand: Secondary | ICD-10-CM

## 2018-05-19 DIAGNOSIS — I7300 Raynaud's syndrome without gangrene: Secondary | ICD-10-CM

## 2018-05-19 NOTE — Progress Notes (Signed)
Office Visit Note  Patient: Ruth Wells             Date of Birth: 1985/09/20           MRN: [id number]             PCP: Haywood Pao, MD Referring: Haywood Pao, MD Visit Date: 05/19/2018 Occupation: @GUAROCC @  Subjective:  Right hand swelling   History of Present Illness: Ruth Wells is a 33 y.o. female with history of seropositive rheumatoid arthritis, Raynaud's, and osteoarthritis.  She is on MTX 10 tablets po once weekly, folic acid 2 mg po daily, and PLQ 200 mg BID M-F.  She denies missing any doses recently.  She denies any recent infections.  She is currently working from home due to the coronavirus.  She denies any recent rheumatoid arthritis flares.  She reports yesterday she was polishing brass and has noticed mild swelling in her right hand but denies any pain or tenderness.  She denies taking any OTC products.  She states the swelling has resolved.  She denies any other joint pain or joint swelling.  She denies any morning stiffness.    Activities of Daily Living:  Patient reports morning stiffness for 0 minutes.   Patient Denies nocturnal pain.  Difficulty dressing/grooming: Denies Difficulty climbing stairs: Denies Difficulty getting out of chair: Denies Difficulty using hands for taps, buttons, cutlery, and/or writing: Denies  Review of Systems  Constitutional: Negative for fatigue.  HENT: Negative for mouth sores, mouth dryness and nose dryness.   Eyes: Negative for pain, visual disturbance and dryness.  Respiratory: Negative for cough, hemoptysis, shortness of breath and difficulty breathing.   Cardiovascular: Negative for chest pain, palpitations, hypertension and swelling in legs/feet.  Gastrointestinal: Negative for blood in stool, constipation and diarrhea.  Endocrine: Negative for increased urination.  Genitourinary: Negative for painful urination.  Musculoskeletal: Positive for joint swelling. Negative for arthralgias, joint pain,  myalgias, muscle weakness, morning stiffness, muscle tenderness and myalgias.  Skin: Negative for color change, pallor, rash, hair loss, nodules/bumps, skin tightness, ulcers and sensitivity to sunlight.  Neurological: Negative for dizziness, numbness, headaches and weakness.  Hematological: Negative for swollen glands.  Psychiatric/Behavioral: Negative for depressed mood and sleep disturbance. The patient is not nervous/anxious.     PMFS History:  Patient Active Problem List   Diagnosis Date Noted  . Primary osteoarthritis of both hands 02/24/2016  . Primary osteoarthritis of both feet 02/24/2016  . High risk medication use 02/15/2016  . Raynaud's syndrome without gangrene 02/15/2016  . Rheumatoid arthritis with rheumatoid factor of multiple sites without organ or systems involvement ([place]) 02/15/2016    Past Medical History:  Diagnosis Date  . Arthritis   . Hypertension     Family History  Problem Relation Age of Onset  . Arthritis Mother    Past Surgical History:  Procedure Laterality Date  . WISDOM TOOTH EXTRACTION     Social History   Social History Narrative  . Not on file   Immunization History  Administered Date(s) Administered  . Pneumococcal Polysaccharide-23 02/26/2016     Objective: Vital Signs: BP (!) 114/95 (BP Location: Left Arm, Patient Position: Sitting, Cuff Size: Normal)   Pulse 87   Resp 12   Ht 5\' 5"  (1.651 m)   Wt 165 lb 6.4 oz (75 kg)   BMI 27.52 kg/m    Physical Exam Vitals signs and nursing note reviewed.  Constitutional:      Appearance: She is well-developed.  HENT:     Head: Normocephalic and atraumatic.  Eyes:     Conjunctiva/sclera: Conjunctivae normal.  Neck:     Musculoskeletal: Normal range of motion.  Cardiovascular:     Rate and Rhythm: Normal rate and regular rhythm.     Heart sounds: Normal heart sounds.  Pulmonary:     Effort: Pulmonary effort is normal.     Breath sounds: Normal breath sounds.  Abdominal:      General: Bowel sounds are normal.     Palpations: Abdomen is soft.  Lymphadenopathy:     Cervical: No cervical adenopathy.  Skin:    General: Skin is warm and dry.     Capillary Refill: Capillary refill takes less than 2 seconds.  Neurological:     Mental Status: She is alert and oriented to person, place, and time.  Psychiatric:        Behavior: Behavior normal.      Musculoskeletal Exam: C-spine, thoracic spine, and lumbar spine good ROM.  No midline spinal tenderness. No SI joint tenderness.  Shoulder joints, elbow joints, wrist joints, MCPs, PIPs, DIPs good ROM with no synovitis or tenderness.  Hip joints, knee joints, ankle joints, MTPs, PIPs ,and DIPs good ROM with no synovitis.  No warmth or effusion of knee joints.  No tenderness or swelling of ankle joints.  No tenderness over trochanteric bursa bilaterally.    CDAI Exam: CDAI Score: 0.2  Patient Global Assessment: 1 (mm); Provider Global Assessment: 1 (mm) Swollen: 0 ; Tender: 0  Joint Exam   Not documented   There is currently no information documented on the homunculus. Go to the Rheumatology activity and complete the homunculus joint exam.  Investigation: No additional findings.  Imaging: Xr Foot 2 Views Left  Result Date: 05/19/2018 PIP and DIP narrowing was noted.  No MTP joint space narrowing was noted.  Articular osteopenia was noted.  A possible erosion was noted over the fifth MTP joint.  No intertarsal or tibiotalar joint space narrowing was noted. Impression: These findings are consistent with osteoarthritis and rheumatoid arthritis overlap.  No radiographic progression was noted from the x-rays in July 2017.  Xr Foot 2 Views Right  Result Date: 05/19/2018 First MTP PIP and DIP narrowing was noted.  Erosive changes were noted in the fifth MTP joint with narrowing of the fifth MTP joint.  No intertarsal joint space narrowing was noted.  No subtalar joint space narrowing was noted. Impression: These findings are  consistent with rheumatoid arthritis and osteoarthritis overlap.  No radiographic progression was noted from the x-rays of July 2017.  Xr Hand 2 View Left  Result Date: 05/19/2018 Juxta-articular osteopenia was noted.  No MCP joint narrowing was noted.  Minimal PIP joint space narrowing was noted.  Mild intercarpal joint space narrowing was noted.  No erosive changes were noted. Impression: These findings are consistent with rheumatoid arthritis.  No radiographic progression was needed from the x-rays of July 2017.  Xr Hand 2 View Right  Result Date: 05/19/2018 Juxta-articular osteopenia was noted.  No significant MCP narrowing was noted.  No erosive changes were noted.  Minimal PIP narrowing was noted.  Metacarpocarpal, intercarpal and radiocarpal joint space narrowing was noted.  Impression: These findings are consistent with rheumatoid arthritis.  No radiographic progression was noted from the x-rays of July 2017.   Recent Labs: Lab Results  Component Value Date   WBC 4.8 03/29/2018   HGB 14.1 03/29/2018   PLT 215 03/29/2018   NA 139 03/29/2018  K 4.4 03/29/2018   CL 108 03/29/2018   CO2 25 03/29/2018   GLUCOSE 90 03/29/2018   BUN 9 03/29/2018   CREATININE 0.86 03/29/2018   BILITOT 0.3 03/29/2018   ALKPHOS 42 10/07/2016   AST 17 03/29/2018   ALT 19 03/29/2018   PROT 6.1 03/29/2018   ALBUMIN 4.0 10/07/2016   CALCIUM 8.4 (L) 03/29/2018   GFRAA 104 03/29/2018    Speciality Comments: PLQ Eye exam: 10/07/17 WNL At [facility] Opthal[facility]    Procedures:  No procedures performed Allergies: Patient has no known allergies.   Assessment / Plan:     Visit Diagnoses: Rheumatoid arthritis with rheumatoid factor of multiple sites without organ or systems involvement ([place]) - Positive RF, negative anti-CCP erosive disease, history of JRA: She has no synovitis on exam.  She is clinically doing well on Methotrexate 10 tablets po once weekly, folic acid 2 mg po daily, and PLQ 200 mg BID M-F.   She has not had any recent flares.  She was polishing brass yesterday and noticed joint swelling in her right hand afterwards.  She has no joint tenderness or synovitis on exam.  She has complete fist formation bilaterally.  She has no other joint pain or joint swelling.  She has no morning stiffness.  X-rays of both hands and both feet were obtained today to assess for radiographic progression.  She will continue on the current treatment regimen.  She does not need any refills at this time.  She was advised to notify us if she develops increased joint pain or joint swelling.  She will follow up in 5 months.- Plan: XR Hand 2 View Left, XR Hand 2 View Right, XR Foot 2 Views Right, XR Foot 2 Views Left  High risk medication use - Methotrexate 10  tablets by mouth every week, folic acid 2 mg by mouth daily, and Plaquenil 200 mg by mouth twice a day Monday through Friday.  CBC and CMP were drawn on 03/29/18.  She will return for lab work in May and every 3 months.    Raynaud's syndrome without gangrene: She has not had any symptoms of Raynaud's recently.  She has no digital ulcerations or signs of gangrene.    Primary osteoarthritis of both hands: She has no tenderness or discomfort at this time.  She has complete fist formation.  No synovitis.  Joint protection and muscle strengthening were discussed.   Primary osteoarthritis of both feet: She has no discomfort at this time.  She wears proper fitting shoes.   Orders: Orders Placed This Encounter  Procedures  . XR Hand 2 View Left  . XR Hand 2 View Right  . XR Foot 2 Views Right  . XR Foot 2 Views Left   No orders of the defined types were placed in this encounter.   Follow-Up Instructions: Return in about 5 months (around 10/19/2018) for Rheumatoid arthritis, Osteoarthritis.   Hazel Sams PA-C  I examined and evaluated the patient with Hazel Sams PA.  Patient had no synovitis on my examination today.  The plan of care was discussed as noted  above.  Bo Merino, MD  Note - This record has been created using Editor, commissioning.  Chart creation errors have been sought, but may not always  have been located. Such creation errors do not reflect on  the standard of medical care.

## 2018-05-19 NOTE — Patient Instructions (Signed)
Standing Labs We placed an order today for your standing lab work.    Please come back and get your standing labs in May and every 3 months   We have open lab Monday through Friday from 8:30-11:30 AM and 1:30-4:00 PM  at the office of Dr. Shaili Deveshwar.   You may experience shorter wait times on Monday and Friday afternoons. The office is located at [address], [address], [place],  [address] No appointment is necessary.   Labs are drawn by [facility].  You may receive a bill from [facility] for your lab work.  If you wish to have your labs drawn at another location, please call the office 24 hours in advance to send orders.  If you have any questions regarding directions or hours of operation,  please call [phone].   Just as a reminder please drink plenty of water prior to coming for your lab work. Thanks!   

## 2018-05-20 ENCOUNTER — Ambulatory Visit: Payer: BC Managed Care – PPO | Admitting: Rheumatology

## 2018-07-04 ENCOUNTER — Other Ambulatory Visit: Payer: Self-pay | Admitting: Rheumatology

## 2018-07-05 NOTE — Telephone Encounter (Signed)
Last Visit: 05/19/18 Next visit: 10/19/18 Labs: 03/29/18 Calcium is low-8.4. MCH stable. Rest of CBC WNL. We will continue to monitor lab work PLQ Eye exam: 10/07/17 WNL   Okay to refill per Dr. Estanislado Pandy

## 2018-07-31 ENCOUNTER — Other Ambulatory Visit: Payer: Self-pay | Admitting: Rheumatology

## 2018-08-02 NOTE — Telephone Encounter (Signed)
Last Visit: 05/19/18 Next visit: 10/19/18 Labs: 06/17/18 MCV 100.2, MCH 33.3 RDW 10.8 MPV 6.6   Okay to refill per Dr. Estanislado Pandy

## 2018-10-06 NOTE — Progress Notes (Signed)
Office Visit Note  Patient: Ruth Wells             Date of Birth: 05-Jun-1985           MRN: [id number]             PCP: Haywood Pao, MD Referring: Haywood Pao, MD Visit Date: 10/20/2018 Occupation: @GUAROCC @  Subjective:  Medication monitoring   History of Present Illness: Ruth Wells is a 33 y.o. female with history of seropositive rheumatoid arthritis, osteoarthritis, and Raynaud's.  Patient is taking methotrexate 10 tablets by mouth once weekly, folic acid 2 mg by mouth daily, Plaquenil 200 mg 1 tablet by mouth twice daily Monday through Friday.  She has not missed any doses of these medications recently.  She has not had any recent rheumatoid arthritis flares.  She states that occasionally she will have some discomfort in her hands or wrists after overuse activities.  She uses Voltaren gel topically and very rarely takes ibuprofen for pain relief.  She denies any joint pain or joint swelling at this time.  She denies any morning stiffness.  She denies any symptoms of Raynolds in over 5 years since moving from [place].  She does not need any refills of her medications at this time.  She reports that she had Plaquenil eye exam on Friday and will have the results faxed to our office.  Activities of Daily Living:  Patient reports morning stiffness for 0 minutes.   Patient Denies nocturnal pain.  Difficulty dressing/grooming: Denies Difficulty climbing stairs: Denies Difficulty getting out of chair: Denies Difficulty using hands for taps, buttons, cutlery, and/or writing: Denies  Review of Systems  Constitutional: Positive for fatigue.  HENT: Negative for mouth sores, trouble swallowing, trouble swallowing, mouth dryness and nose dryness.   Eyes: Negative for itching and dryness.  Respiratory: Negative for shortness of breath, wheezing and difficulty breathing.   Cardiovascular: Negative for chest pain and palpitations.  Gastrointestinal: Negative for  abdominal pain, blood in stool, constipation and diarrhea.  Endocrine: Negative for increased urination.  Genitourinary: Negative for difficulty urinating and painful urination.  Musculoskeletal: Negative for arthralgias, joint pain, joint swelling and morning stiffness.  Skin: Negative for rash, hair loss and redness.  Allergic/Immunologic: Negative for susceptible to infections.  Neurological: Positive for headaches. Negative for dizziness, light-headedness, numbness, memory loss and weakness.  Hematological: Negative for bruising/bleeding tendency.  Psychiatric/Behavioral: Negative for confusion and sleep disturbance. The patient is nervous/anxious.     PMFS History:  Patient Active Problem List   Diagnosis Date Noted  . Primary osteoarthritis of both hands 02/24/2016  . Primary osteoarthritis of both feet 02/24/2016  . High risk medication use 02/15/2016  . Raynaud's syndrome without gangrene 02/15/2016  . Rheumatoid arthritis with rheumatoid factor of multiple sites without organ or systems involvement ([place]) 02/15/2016    Past Medical History:  Diagnosis Date  . Arthritis   . Hypertension     Family History  Problem Relation Age of Onset  . Arthritis Mother    Past Surgical History:  Procedure Laterality Date  . WISDOM TOOTH EXTRACTION     Social History   Social History Narrative  . Not on file   Immunization History  Administered Date(s) Administered  . Pneumococcal Polysaccharide-23 02/26/2016     Objective: Vital Signs: BP 119/86 (BP Location: Left Arm, Patient Position: Sitting, Cuff Size: Normal)   Pulse 93   Resp 13   Ht 5\' 5"  (1.651 m)  Wt 165 lb 12.8 oz (75.2 kg)   BMI 27.59 kg/m    Physical Exam Vitals signs and nursing note reviewed.  Constitutional:      Appearance: She is well-developed.  HENT:     Head: Normocephalic and atraumatic.  Eyes:     Conjunctiva/sclera: Conjunctivae normal.  Neck:     Musculoskeletal: Normal range of motion.   Cardiovascular:     Rate and Rhythm: Normal rate and regular rhythm.     Heart sounds: Normal heart sounds.  Pulmonary:     Effort: Pulmonary effort is normal.     Breath sounds: Normal breath sounds.  Abdominal:     General: Bowel sounds are normal.     Palpations: Abdomen is soft.  Lymphadenopathy:     Cervical: No cervical adenopathy.  Skin:    General: Skin is warm and dry.     Capillary Refill: Capillary refill takes less than 2 seconds.  Neurological:     Mental Status: She is alert and oriented to person, place, and time.  Psychiatric:        Behavior: Behavior normal.      Musculoskeletal Exam: C-spine, thoracic spine, lumbar spine good range of motion.  No midline spinal tenderness.  No SI joint tenderness.  Shoulder joints, elbow joints, wrist joints, MCPs, PIPs and DIPs good range of motion with no synovitis.  She has complete fist formation bilaterally.  Hip joints, knee joints, ankle joints, MTPs and PIPs and DIPs good range of motion no synovitis.  No warmth or effusion of bilateral knee joints.  No tenderness swelling of ankle joints.  No tenderness over MTP joints.  No tenderness over trochanteric bursa bilaterally.  CDAI Exam: CDAI Score: 0.2  Patient Global: 1 mm; Provider Global: 1 mm Swollen: 0 ; Tender: 0  Joint Exam   No joint exam has been documented for this visit   There is currently no information documented on the homunculus. Go to the Rheumatology activity and complete the homunculus joint exam.  Investigation: No additional findings.  Imaging: No results found.  Recent Labs: Lab Results  Component Value Date   WBC 4.8 03/29/2018   HGB 14.1 03/29/2018   PLT 215 03/29/2018   NA 139 03/29/2018   K 4.4 03/29/2018   CL 108 03/29/2018   CO2 25 03/29/2018   GLUCOSE 90 03/29/2018   BUN 9 03/29/2018   CREATININE 0.86 03/29/2018   BILITOT 0.3 03/29/2018   ALKPHOS 42 10/07/2016   AST 17 03/29/2018   ALT 19 03/29/2018   PROT 6.1 03/29/2018    ALBUMIN 4.0 10/07/2016   CALCIUM 8.4 (L) 03/29/2018   GFRAA 104 03/29/2018    Speciality Comments: PLQ Eye exam: 10/07/17 WNL At [facility]thal[facility]    Procedures:  No procedures performed Allergies: Patient has no known allergies.   Assessment / Plan:     Visit Diagnoses: Rheumatoid arthritis with rheumatoid factor of multiple sites without organ or systems involvement ([place]) - Positive RF, negative anti-CCP erosive disease, history of JRA: She has no synovitis on exam.  She denies any recent rheumatoid arthritis flares.  She is clinically doing well on methotrexate 10 tablets by mouth once weekly, folic acid 2 mg by mouth daily, Plaquenil 200 mg 1 tablet by mouth twice daily Monday through Friday.  She has not missed any doses of these medications recently.  She has no joint pain or joint swelling at this time.  She has no morning stiffness.  She is occasional discomfort in  her hands and wrists if she performs overuse activities.  She uses Voltaren gel topically as needed during these times.  She takes ibuprofen very rarely for pain relief.  She will continue on the current treatment regimen.  She does not need any refills at this time.  She was advised to notify us if she develops increased joint pain or joint swelling.  She will follow-up in the office in 5 months.  High risk medication use - Methotrexate 10  tablets by mouth every week, folic acid 2 mg by mouth daily, and Plaquenil 200 mg by mouth twice a day Monday through Friday.  She had a Plaquenil eye exam on 10/15/2018 and will have the results faxed to our office.  According to the patient the eye exam was normal.  CBC and CMP will be drawn today to monitor for drug toxicity.  She will return in November and every 3 months for lab work.  Standing orders are in place.- Plan: CBC with Differential/Platelet, COMPLETE METABOLIC PANEL WITH GFR  Raynaud's syndrome without gangrene: She has not had any symptoms of Raynaud's in over 5 years  since moving from [place].  We discussed the signs and symptoms today.  She has no digital ulcerations or signs of gangrene.  She has good capillary refill.  We discussed that if she does develop symptoms to keep her core body temperature warm and wear thick gloves and socks.  Primary osteoarthritis of both hands: She has no tenderness or synovitis.  She has complete fist formation bilaterally.  She has no discomfort or stiffness.  Joint protection and muscle strengthening were discussed.  Primary osteoarthritis of both feet: She has no feet pain at this time.  She wears proper fitting shoes.  Orders: Orders Placed This Encounter  Procedures  . CBC with Differential/Platelet  . COMPLETE METABOLIC PANEL WITH GFR   No orders of the defined types were placed in this encounter.    Follow-Up Instructions: Return in about 5 months (around 03/22/2019) for Rheumatoid arthritis, Raynaud's syndrome.   Ofilia Neas, PA-C  Note - This record has been created using Dragon software.  Chart creation errors have been sought, but may not always  have been located. Such creation errors do not reflect on  the standard of medical care.

## 2018-10-19 ENCOUNTER — Ambulatory Visit: Payer: Self-pay | Admitting: Physician Assistant

## 2018-10-20 ENCOUNTER — Other Ambulatory Visit: Payer: Self-pay

## 2018-10-20 ENCOUNTER — Encounter: Payer: Self-pay | Admitting: Physician Assistant

## 2018-10-20 ENCOUNTER — Ambulatory Visit: Payer: BC Managed Care – PPO | Admitting: Physician Assistant

## 2018-10-20 VITALS — BP 119/86 | HR 93 | Resp 13 | Ht 65.0 in | Wt 165.8 lb

## 2018-10-20 DIAGNOSIS — M19071 Primary osteoarthritis, right ankle and foot: Secondary | ICD-10-CM

## 2018-10-20 DIAGNOSIS — I7300 Raynaud's syndrome without gangrene: Secondary | ICD-10-CM

## 2018-10-20 DIAGNOSIS — M0579 Rheumatoid arthritis with rheumatoid factor of multiple sites without organ or systems involvement: Secondary | ICD-10-CM

## 2018-10-20 DIAGNOSIS — Z79899 Other long term (current) drug therapy: Secondary | ICD-10-CM

## 2018-10-20 DIAGNOSIS — M19041 Primary osteoarthritis, right hand: Secondary | ICD-10-CM | POA: Diagnosis not present

## 2018-10-20 DIAGNOSIS — M19042 Primary osteoarthritis, left hand: Secondary | ICD-10-CM

## 2018-10-20 DIAGNOSIS — M19072 Primary osteoarthritis, left ankle and foot: Secondary | ICD-10-CM

## 2018-10-20 NOTE — Patient Instructions (Signed)
Standing Labs We placed an order today for your standing lab work.    Please come back and get your standing labs in November and every 3 months   We have open lab daily Monday through Thursday from 8:30-12:30 PM and 1:30-4:30 PM and Friday from 8:30-12:30 PM and 1:30 -4:00 PM at the office of Dr. Shaili Deveshwar.   You may experience shorter wait times on Monday and Friday afternoons. The office is located at [address], [address], [place], [place] [address] No appointment is necessary.   Labs are drawn by [facility].  You may receive a bill from [facility] for your lab work.  If you wish to have your labs drawn at another location, please call the office 24 hours in advance to send orders.  If you have any questions regarding directions or hours of operation,  please call [phone].   Just as a reminder please drink plenty of water prior to coming for your lab work. Thanks 

## 2018-10-21 LAB — CBC WITH DIFFERENTIAL/PLATELET
Absolute Monocytes: 239 cells/uL (ref 200–950)
Basophils Absolute: 0 cells/uL (ref 0–200)
Basophils Relative: 0 %
Eosinophils Absolute: 51 cells/uL (ref 15–500)
Eosinophils Relative: 1.1 %
HCT: 42.6 % (ref 35.0–45.0)
Hemoglobin: 14.3 g/dL (ref 11.7–15.5)
Lymphs Abs: 1389 cells/uL (ref 850–3900)
MCH: 33.5 pg — ABNORMAL HIGH (ref 27.0–33.0)
MCHC: 33.6 g/dL (ref 32.0–36.0)
MCV: 99.8 fL (ref 80.0–100.0)
MPV: 9.3 fL (ref 7.5–12.5)
Monocytes Relative: 5.2 %
Neutro Abs: 2921 cells/uL (ref 1500–7800)
Neutrophils Relative %: 63.5 %
Platelets: 234 10*3/uL (ref 140–400)
RBC: 4.27 10*6/uL (ref 3.80–5.10)
RDW: 12.3 % (ref 11.0–15.0)
Total Lymphocyte: 30.2 %
WBC: 4.6 10*3/uL (ref 3.8–10.8)

## 2018-10-21 LAB — COMPLETE METABOLIC PANEL WITH GFR
AG Ratio: 1.9 (calc) (ref 1.0–2.5)
ALT: 15 U/L (ref 6–29)
AST: 16 U/L (ref 10–30)
Albumin: 4.2 g/dL (ref 3.6–5.1)
Alkaline phosphatase (APISO): 42 U/L (ref 31–125)
BUN: 10 mg/dL (ref 7–25)
CO2: 23 mmol/L (ref 20–32)
Calcium: 8.8 mg/dL (ref 8.6–10.2)
Chloride: 108 mmol/L (ref 98–110)
Creat: 0.92 mg/dL (ref 0.50–1.10)
GFR, Est African American: 95 mL/min/{1.73_m2} (ref >=60)
GFR, Est Non African American: 82 mL/min/{1.73_m2} (ref >=60)
Globulin: 2.2 g/dL (calc) (ref 1.9–3.7)
Glucose, Bld: 93 mg/dL (ref 65–99)
Potassium: 4.6 mmol/L (ref 3.5–5.3)
Sodium: 138 mmol/L (ref 135–146)
Total Bilirubin: 0.4 mg/dL (ref 0.2–1.2)
Total Protein: 6.4 g/dL (ref 6.1–8.1)

## 2018-10-21 NOTE — Progress Notes (Signed)
MCH is elevated but stable.  Rest of CBC WNL.  CMP WNL.

## 2018-10-24 ENCOUNTER — Other Ambulatory Visit: Payer: Self-pay | Admitting: Rheumatology

## 2018-10-24 DIAGNOSIS — M0579 Rheumatoid arthritis with rheumatoid factor of multiple sites without organ or systems involvement: Secondary | ICD-10-CM

## 2018-10-25 NOTE — Telephone Encounter (Signed)
Last Visit: 10/20/18 Next Visit: 03/24/19 Labs: 10/20/18 MCH is elevated but stable. Rest of CBC WNL. CMP WNL.  Eye exam: 10/08/18 WNL  Okay to refill per Dr. Estanislado Pandy

## 2019-01-11 ENCOUNTER — Other Ambulatory Visit: Payer: Self-pay | Admitting: *Deleted

## 2019-01-11 MED ORDER — METHOTREXATE 2.5 MG PO TABS: ORAL_TABLET | ORAL | 0 refills | Status: DC

## 2019-01-11 NOTE — Telephone Encounter (Signed)
Refill request received via fax  Last Visit: 10/20/18 Next Visit: 03/24/19 Labs: 10/20/18 MCH is elevated but stable. Rest of CBC WNL. CMP WNL.   Okay to refill per Dr. Estanislado Pandy

## 2019-01-14 ENCOUNTER — Other Ambulatory Visit: Payer: Self-pay | Admitting: Physician Assistant

## 2019-01-14 DIAGNOSIS — M0579 Rheumatoid arthritis with rheumatoid factor of multiple sites without organ or systems involvement: Secondary | ICD-10-CM

## 2019-01-14 DIAGNOSIS — Z79899 Other long term (current) drug therapy: Secondary | ICD-10-CM

## 2019-01-15 LAB — COMPLETE METABOLIC PANEL WITH GFR
AG Ratio: 1.8 (calc) (ref 1.0–2.5)
ALT: 22 U/L (ref 6–29)
AST: 20 U/L (ref 10–30)
Albumin: 4.2 g/dL (ref 3.6–5.1)
Alkaline phosphatase (APISO): 49 U/L (ref 31–125)
BUN: 11 mg/dL (ref 7–25)
CO2: 21 mmol/L (ref 20–32)
Calcium: 8.8 mg/dL (ref 8.6–10.2)
Chloride: 108 mmol/L (ref 98–110)
Creat: 0.99 mg/dL (ref 0.50–1.10)
GFR, Est African American: 87 mL/min/{1.73_m2} (ref >=60)
GFR, Est Non African American: 75 mL/min/{1.73_m2} (ref >=60)
Globulin: 2.4 g/dL (calc) (ref 1.9–3.7)
Glucose, Bld: 98 mg/dL (ref 65–99)
Potassium: 4 mmol/L (ref 3.5–5.3)
Sodium: 140 mmol/L (ref 135–146)
Total Bilirubin: 0.5 mg/dL (ref 0.2–1.2)
Total Protein: 6.6 g/dL (ref 6.1–8.1)

## 2019-01-15 LAB — CBC WITH DIFFERENTIAL/PLATELET
Absolute Monocytes: 353 cells/uL (ref 200–950)
Basophils Absolute: 8 cells/uL (ref 0–200)
Basophils Relative: 0.2 %
Eosinophils Absolute: 49 cells/uL (ref 15–500)
Eosinophils Relative: 1.2 %
HCT: 44.5 % (ref 35.0–45.0)
Hemoglobin: 15.4 g/dL (ref 11.7–15.5)
Lymphs Abs: 1513 cells/uL (ref 850–3900)
MCH: 33.6 pg — ABNORMAL HIGH (ref 27.0–33.0)
MCHC: 34.6 g/dL (ref 32.0–36.0)
MCV: 96.9 fL (ref 80.0–100.0)
MPV: 9.9 fL (ref 7.5–12.5)
Monocytes Relative: 8.6 %
Neutro Abs: 2177 cells/uL (ref 1500–7800)
Neutrophils Relative %: 53.1 %
Platelets: 232 10*3/uL (ref 140–400)
RBC: 4.59 10*6/uL (ref 3.80–5.10)
RDW: 12.3 % (ref 11.0–15.0)
Total Lymphocyte: 36.9 %
WBC: 4.1 10*3/uL (ref 3.8–10.8)

## 2019-01-17 NOTE — Progress Notes (Signed)
MCH is borderline elevated but stable. Rest of CBC WNL.  CMP WNL.

## 2019-02-05 ENCOUNTER — Other Ambulatory Visit: Payer: Self-pay | Admitting: Rheumatology

## 2019-02-07 NOTE — Telephone Encounter (Signed)
Last Visit: 10/20/2018 Next Visit: 03/24/2019 Labs: 01/14/2019 MCH is borderline elevated but stable. Rest of CBC WNL. CMP WNL.  Okay to refill per Dr. Estanislado Pandy.

## 2019-03-12 ENCOUNTER — Other Ambulatory Visit: Payer: Self-pay | Admitting: Rheumatology

## 2019-03-12 DIAGNOSIS — M0579 Rheumatoid arthritis with rheumatoid factor of multiple sites without organ or systems involvement: Secondary | ICD-10-CM

## 2019-03-15 NOTE — Telephone Encounter (Signed)
Last Visit: 10/20/2018 Next Visit: 04/12/19 Labs: 01/14/2019 MCH is borderline elevated but stable. Rest of CBC WNL. CMP WNL. PLQ Eye exam: 10/08/18 WNL   Okay to refill per Dr. Estanislado Pandy

## 2019-03-24 ENCOUNTER — Ambulatory Visit: Payer: BC Managed Care – PPO | Admitting: Physician Assistant

## 2019-04-06 NOTE — Progress Notes (Deleted)
Office Visit Note  Patient: Ruth Wells             Date of Birth: [birth year]           MRN: [id number]             PCP: Haywood Pao, MD Referring: Haywood Pao, MD Visit Date: 04/12/2019 Occupation: @GUAROCC @  Subjective:  No chief complaint on file.   History of Present Illness: Ruth Wells is a 34 y.o. female ***   Activities of Daily Living:  Patient reports morning stiffness for *** {minute/hour:19697}.   Patient {ACTIONS;DENIES/REPORTS:21021675::"Denies"} nocturnal pain.  Difficulty dressing/grooming: {ACTIONS;DENIES/REPORTS:21021675::"Denies"} Difficulty climbing stairs: {ACTIONS;DENIES/REPORTS:21021675::"Denies"} Difficulty getting out of chair: {ACTIONS;DENIES/REPORTS:21021675::"Denies"} Difficulty using hands for taps, buttons, cutlery, and/or writing: {ACTIONS;DENIES/REPORTS:21021675::"Denies"}  No Rheumatology ROS completed.   PMFS History:  Patient Active Problem List   Diagnosis Date Noted  . Primary osteoarthritis of both hands 02/24/2016  . Primary osteoarthritis of both feet 02/24/2016  . High risk medication use 02/15/2016  . Raynaud's syndrome without gangrene 02/15/2016  . Rheumatoid arthritis with rheumatoid factor of multiple sites without organ or systems involvement ([place]) 02/15/2016    Past Medical History:  Diagnosis Date  . Arthritis   . Hypertension     Family History  Problem Relation Age of Onset  . Arthritis Mother    Past Surgical History:  Procedure Laterality Date  . WISDOM TOOTH EXTRACTION     Social History   Social History Narrative  . Not on file   Immunization History  Administered Date(s) Administered  . Pneumococcal Polysaccharide-23 02/26/2016     Objective: Vital Signs: There were no vitals taken for this visit.   Physical Exam   Musculoskeletal Exam: ***  CDAI Exam: CDAI Score: -- Patient Global: --; Provider Global: -- Swollen: --; Tender: -- Joint Exam 04/12/2019   No joint  exam has been documented for this visit   There is currently no information documented on the homunculus. Go to the Rheumatology activity and complete the homunculus joint exam.  Investigation: No additional findings.  Imaging: No results found.  Recent Labs: Lab Results  Component Value Date   WBC 4.1 01/14/2019   HGB 15.4 01/14/2019   PLT 232 01/14/2019   NA 140 01/14/2019   K 4.0 01/14/2019   CL 108 01/14/2019   CO2 21 01/14/2019   GLUCOSE 98 01/14/2019   BUN 11 01/14/2019   CREATININE 0.99 01/14/2019   BILITOT 0.5 01/14/2019   ALKPHOS 42 10/07/2016   AST 20 01/14/2019   ALT 22 01/14/2019   PROT 6.6 01/14/2019   ALBUMIN 4.0 10/07/2016   CALCIUM 8.8 01/14/2019   GFRAA 87 01/14/2019    Speciality Comments: PLQ Eye exam: 10/08/18 WNL At [facility] Follow up in 1 year  Procedures:  No procedures performed Allergies: Patient has no known allergies.   Assessment / Plan:     Visit Diagnoses: No diagnosis found.  Orders: No orders of the defined types were placed in this encounter.  No orders of the defined types were placed in this encounter.   Face-to-face time spent with patient was *** minutes. Greater than 50% of time was spent in counseling and coordination of care.  Follow-Up Instructions: No follow-ups on file.   Earnestine Mealing, CMA  Note - This record has been created using Editor, commissioning.  Chart creation errors have been sought, but may not always  have been located. Such creation errors do not reflect on  the standard  of medical care.

## 2019-04-12 ENCOUNTER — Ambulatory Visit: Payer: BC Managed Care – PPO | Admitting: Physician Assistant

## 2019-04-18 NOTE — Progress Notes (Signed)
Office Visit Note  Patient: Ruth Wells             Date of Birth: [birth year]           MRN: [id number]             PCP: Haywood Pao, MD Referring: Haywood Pao, MD Visit Date: 04/21/2019 Occupation: @GUAROCC @  Subjective:  Medication monitoring  History of Present Illness: Ruth Wells is a 34 y.o. female with history of seropositive rheumatoid arthritis, osteoarthritis, and Raynaud's syndrome.  Patient is taking methotrexate 10 tablets by mouth once weekly, folic acid 2 mg by mouth daily, Plaquenil 200 mg 1 tablet twice daily Monday through Friday.  She denies any recent rheumatoid arthritis flares.  She states that recently she was coloring for about 30 minutes discussing increased stiffness in the right second and third MCP joints but denies any joint swelling or tenderness at this time.  She states she also recently experience discomfort in the left trochanter bursa which resolved within 24 hours.  She states that she iced the region and her symptoms improved significantly.  She denies any other joint pain or joint swelling at this time.  She states that she plans on getting the COVID-19 vaccine once is available to her  Activities of Daily Living:  Patient reports morning stiffness for 0 none.   Patient Denies nocturnal pain.  Difficulty dressing/grooming: Denies Difficulty climbing stairs: Denies Difficulty getting out of chair: Denies Difficulty using hands for taps, buttons, cutlery, and/or writing: Denies  Review of Systems  Constitutional: Positive for fatigue.  HENT: Negative for mouth sores, mouth dryness and nose dryness.   Eyes: Negative for pain, visual disturbance and dryness.  Respiratory: Negative for cough, hemoptysis, shortness of breath and difficulty breathing.   Cardiovascular: Negative for chest pain, palpitations, hypertension and swelling in legs/feet.  Gastrointestinal: Negative for blood in stool, constipation and diarrhea.   Endocrine: Negative for excessive thirst and increased urination.  Genitourinary: Negative for painful urination.  Musculoskeletal: Positive for arthralgias, joint pain and joint swelling. Negative for myalgias, muscle weakness, morning stiffness, muscle tenderness and myalgias.  Skin: Negative for color change, pallor, rash, hair loss, nodules/bumps, skin tightness, ulcers and sensitivity to sunlight.  Allergic/Immunologic: Negative for susceptible to infections.  Neurological: Negative for dizziness, numbness, headaches and weakness.  Hematological: Negative for bruising/bleeding tendency and swollen glands.  Psychiatric/Behavioral: Negative for depressed mood and sleep disturbance. The patient is not nervous/anxious.     PMFS History:  Patient Active Problem List   Diagnosis Date Noted  . Primary osteoarthritis of both hands 02/24/2016  . Primary osteoarthritis of both feet 02/24/2016  . High risk medication use 02/15/2016  . Raynaud's syndrome without gangrene 02/15/2016  . Rheumatoid arthritis with rheumatoid factor of multiple sites without organ or systems involvement ([place]) 02/15/2016    Past Medical History:  Diagnosis Date  . Arthritis   . Hypertension     Family History  Problem Relation Age of Onset  . Arthritis Mother    Past Surgical History:  Procedure Laterality Date  . WISDOM TOOTH EXTRACTION     Social History   Social History Narrative  . Not on file   Immunization History  Administered Date(s) Administered  . Pneumococcal Polysaccharide-23 02/26/2016     Objective: Vital Signs: BP 113/76 (BP Location: Left Arm, Patient Position: Sitting, Cuff Size: Normal)   Pulse 87   Resp 14   Ht 5\' 5"  (1.651 m)   Wt 173  lb (78.5 kg)   BMI 28.79 kg/m    Physical Exam Vitals and nursing note reviewed.  Constitutional:      Appearance: She is well-developed.  HENT:     Head: Normocephalic and atraumatic.  Eyes:     Conjunctiva/sclera: Conjunctivae normal.   Pulmonary:     Effort: Pulmonary effort is normal.  Abdominal:     General: Bowel sounds are normal.     Palpations: Abdomen is soft.  Musculoskeletal:     Cervical back: Normal range of motion.  Lymphadenopathy:     Cervical: No cervical adenopathy.  Skin:    General: Skin is warm and dry.     Capillary Refill: Capillary refill takes less than 2 seconds.  Neurological:     Mental Status: She is alert and oriented to person, place, and time.  Psychiatric:        Behavior: Behavior normal.      Musculoskeletal Exam: C-spine, thoracic spine, lumbar spine good range of motion.  No midline spinal tenderness.  No SI joint tenderness.  Shoulder joints, elbow joints, wrist joints, MCPs, PIPs, DIPs good range of motion with no synovitis.  She has complete fist formation bilaterally.  Hip joints have good range of motion with no discomfort.  No tenderness over trochanteric bursa bilaterally.  Knee joints, ankle joints, MTPs, PIPs and DIPs good range of motion no synovitis.  No warmth or effusion of bilateral knee joints.  No tenderness or swelling of ankle joints.  No tenderness of MTP joints.  CDAI Exam: CDAI Score: 0.4  Patient Global: 2 mm; Provider Global: 2 mm Swollen: 0 ; Tender: 0  Joint Exam 04/21/2019   No joint exam has been documented for this visit   There is currently no information documented on the homunculus. Go to the Rheumatology activity and complete the homunculus joint exam.  Investigation: No additional findings.  Imaging: No results found.  Recent Labs: Lab Results  Component Value Date   WBC 4.1 01/14/2019   HGB 15.4 01/14/2019   PLT 232 01/14/2019   NA 140 01/14/2019   K 4.0 01/14/2019   CL 108 01/14/2019   CO2 21 01/14/2019   GLUCOSE 98 01/14/2019   BUN 11 01/14/2019   CREATININE 0.99 01/14/2019   BILITOT 0.5 01/14/2019   ALKPHOS 42 10/07/2016   AST 20 01/14/2019   ALT 22 01/14/2019   PROT 6.6 01/14/2019   ALBUMIN 4.0 10/07/2016   CALCIUM 8.8  01/14/2019   GFRAA 87 01/14/2019    Speciality Comments: PLQ Eye exam: 10/08/18 WNL At [facility] Follow up in 1 year  Procedures:  No procedures performed Allergies: Patient has no known allergies.   Assessment / Plan:     Visit Diagnoses: Rheumatoid arthritis with rheumatoid factor of multiple sites without organ or systems involvement ([facility]) - Positive RF, negative anti-CCP erosive disease, history of JRA: She has no synovitis on exam today.  She has not had any recent rheumatoid arthritis flares.  She has no joint pain or joint swelling at this time.  She recently was coloring for about 30 minutes which caused some increase stiffness in the right second and third MCP joints but has no tenderness or synovitis on exam today.  She is clinically doing well on methotrexate 10 tablets by mouth once weekly, folic acid 2 mg by mouth daily, and Plaquenil 200 mg 1 tablet twice daily Monday through Friday.  She has not missed any doses of methotrexate or Plaquenil recently.  She will  continue on the current treatment regimen.  She was advised to notify us if she develops increased joint pain or joint swelling.  She will follow-up in the office in 5 months.  High risk medication use - Methotrexate 10  tablets by mouth every week, folic acid 2 mg by mouth daily, and Plaquenil 200 mg by mouth twice a day Monday through Friday. Eye exam: 10/08/18.  CBC and CMP were ordered on 01/14/2019.  CBC and CMP will be drawn today to monitor for drug toxicity.  She will return in May and every 3 months for lab work.  Standing orders were placed today.  We discussed the importance of holding methotrexate if she develops any signs or symptoms of an infection and to resume once the infection is completely cleared.  She was also encouraged to receive the COVID-19 vaccination once it is available to her.  She was advised to hold methotrexate 1 week after the first COVID-19 vaccine.- Plan: COMPLETE METABOLIC PANEL WITH  GFR, CBC with Differential/Platelet  Raynaud's syndrome without gangrene - She has not had any symptoms of Raynaud's in over 5 years since moving from [place].  No digital ulcerations or signs of gangrene were noted.  She is good capillary refill on exam today.  Primary osteoarthritis of both hands: No tenderness or synovitis was noted on exam.  She has complete fist formation bilaterally.  She experiences occasional discomfort in her right hand if she writes or colors for prolonged period of time.  Joint protection and muscle strengthening were discussed.  She can continue to use Voltaren gel topically as needed for pain relief.  Primary osteoarthritis of both feet: She has no discomfort in her feet at this time.  No tenderness or inflammation was noted.  She was proper fitting shoes.  Trochanteric bursitis, left hip: Patient recently experienced an episode of left trochanter bursitis.  She iced the region and her symptoms resolved within 24 hours.  We discussed the importance of changing positions frequently and performing stretching exercises.  She was given a handout of these exercises to perform.  Orders: Orders Placed This Encounter  Procedures  . COMPLETE METABOLIC PANEL WITH GFR  . CBC with Differential/Platelet   No orders of the defined types were placed in this encounter.    Follow-Up Instructions: Return in about 5 months (around 09/18/2019) for Rheumatoid arthritis.   Ofilia Neas, PA-C  Note - This record has been created using Dragon software.  Chart creation errors have been sought, but may not always  have been located. Such creation errors do not reflect on  the standard of medical care.

## 2019-04-21 ENCOUNTER — Encounter: Payer: Self-pay | Admitting: Physician Assistant

## 2019-04-21 ENCOUNTER — Other Ambulatory Visit: Payer: Self-pay

## 2019-04-21 ENCOUNTER — Ambulatory Visit: Payer: BC Managed Care – PPO | Admitting: Physician Assistant

## 2019-04-21 VITALS — BP 113/76 | HR 87 | Resp 14 | Ht 65.0 in | Wt 173.0 lb

## 2019-04-21 DIAGNOSIS — I7300 Raynaud's syndrome without gangrene: Secondary | ICD-10-CM | POA: Diagnosis not present

## 2019-04-21 DIAGNOSIS — M19042 Primary osteoarthritis, left hand: Secondary | ICD-10-CM

## 2019-04-21 DIAGNOSIS — M19071 Primary osteoarthritis, right ankle and foot: Secondary | ICD-10-CM

## 2019-04-21 DIAGNOSIS — M19041 Primary osteoarthritis, right hand: Secondary | ICD-10-CM | POA: Diagnosis not present

## 2019-04-21 DIAGNOSIS — Z79899 Other long term (current) drug therapy: Secondary | ICD-10-CM | POA: Diagnosis not present

## 2019-04-21 DIAGNOSIS — M7062 Trochanteric bursitis, left hip: Secondary | ICD-10-CM

## 2019-04-21 DIAGNOSIS — M0579 Rheumatoid arthritis with rheumatoid factor of multiple sites without organ or systems involvement: Secondary | ICD-10-CM | POA: Diagnosis not present

## 2019-04-21 DIAGNOSIS — M19072 Primary osteoarthritis, left ankle and foot: Secondary | ICD-10-CM

## 2019-04-21 NOTE — Patient Instructions (Signed)
Hip Bursitis Rehab Ask your health care provider which exercises are safe for you. Do exercises exactly as told by your health care provider and adjust them as directed. It is normal to feel mild stretching, pulling, tightness, or discomfort as you do these exercises. Stop right away if you feel sudden pain or your pain gets worse. Do not begin these exercises until told by your health care provider. Stretching exercise This exercise warms up your muscles and joints and improves the movement and flexibility of your hip. This exercise also helps to relieve pain and stiffness. Iliotibial band stretch An iliotibial band is a strong band of muscle tissue that runs from the outer side of your hip to the outer side of your thigh and knee. 1. Lie on your side with your left / right leg in the top position. 2. Bend your left / right knee and grab your ankle. Stretch out your bottom arm to help you balance. 3. Slowly bring your knee back so your thigh is behind your body. 4. Slowly lower your knee toward the floor until you feel a gentle stretch on the outside of your left / right thigh. If you do not feel a stretch and your knee will not fall farther, place the heel of your other foot on top of your knee and pull your knee down toward the floor with your foot. 5. Hold this position for __________ seconds. 6. Slowly return to the starting position. Repeat __________ times. Complete this exercise __________ times a day. Strengthening exercises These exercises build strength and endurance in your hip and pelvis. Endurance is the ability to use your muscles for a long time, even after they get tired. Bridge This exercise strengthens the muscles that move your thigh backward (hip extensors). 1. Lie on your back on a firm surface with your knees bent and your feet flat on the floor. 2. Tighten your buttocks muscles and lift your buttocks off the floor until your trunk is level with your thighs. ? Do not arch  your back. ? You should feel the muscles working in your buttocks and the back of your thighs. If you do not feel these muscles, slide your feet 1-2 inches (2.5-5 cm) farther away from your buttocks. ? If this exercise is too easy, try doing it with your arms crossed over your chest. 3. Hold this position for __________ seconds. 4. Slowly lower your hips to the starting position. 5. Let your muscles relax completely after each repetition. Repeat __________ times. Complete this exercise __________ times a day. Squats This exercise strengthens the muscles in front of your thigh and knee (quadriceps). 1. Stand in front of a table, with your feet and knees pointing straight ahead. You may rest your hands on the table for balance but not for support. 2. Slowly bend your knees and lower your hips like you are going to sit in a chair. ? Keep your weight over your heels, not over your toes. ? Keep your lower legs upright so they are parallel with the table legs. ? Do not let your hips go lower than your knees. ? Do not bend lower than told by your health care provider. ? If your hip pain increases, do not bend as low. 3. Hold the squat position for __________ seconds. 4. Slowly push with your legs to return to standing. Do not use your hands to pull yourself to standing. Repeat __________ times. Complete this exercise __________ times a day. Hip hike 1. Stand   sideways on a bottom step. Stand on your left / right leg with your other foot unsupported next to the step. You can hold on to the railing or wall for balance if needed. 2. Keep your knees straight and your torso square. Then lift your left / right hip up toward the ceiling. 3. Hold this position for __________ seconds. 4. Slowly let your left / right hip lower toward the floor, past the starting position. Your foot should get closer to the floor. Do not lean or bend your knees. Repeat __________ times. Complete this exercise __________ times a  day. Single leg stand 1. Without shoes, stand near a railing or in a doorway. You may hold on to the railing or door frame as needed for balance. 2. Squeeze your left / right buttock muscles, then lift up your other foot. ? Do not let your left / right hip push out to the side. ? It is helpful to stand in front of a mirror for this exercise so you can watch your hip. 3. Hold this position for __________ seconds. Repeat __________ times. Complete this exercise __________ times a day. This information is not intended to replace advice given to you by your health care provider. Make sure you discuss any questions you have with your health care provider. Document Revised: 06/07/2018 Document Reviewed: 06/07/2018 Elsevier Patient Education  2020 [employer]  

## 2019-04-22 LAB — CBC WITH DIFFERENTIAL/PLATELET
Absolute Monocytes: 319 cells/uL (ref 200–950)
Basophils Absolute: 12 cells/uL (ref 0–200)
Basophils Relative: 0.2 %
Eosinophils Absolute: 41 cells/uL (ref 15–500)
Eosinophils Relative: 0.7 %
HCT: 43.5 % (ref 35.0–45.0)
Hemoglobin: 14.8 g/dL (ref 11.7–15.5)
Lymphs Abs: 1735 cells/uL (ref 850–3900)
MCH: 33.6 pg — ABNORMAL HIGH (ref 27.0–33.0)
MCHC: 34 g/dL (ref 32.0–36.0)
MCV: 98.6 fL (ref 80.0–100.0)
MPV: 9.6 fL (ref 7.5–12.5)
Monocytes Relative: 5.4 %
Neutro Abs: 3794 cells/uL (ref 1500–7800)
Neutrophils Relative %: 64.3 %
Platelets: 218 10*3/uL (ref 140–400)
RBC: 4.41 10*6/uL (ref 3.80–5.10)
RDW: 12.6 % (ref 11.0–15.0)
Total Lymphocyte: 29.4 %
WBC: 5.9 10*3/uL (ref 3.8–10.8)

## 2019-04-22 LAB — COMPLETE METABOLIC PANEL WITH GFR
AG Ratio: 1.8 (calc) (ref 1.0–2.5)
ALT: 15 U/L (ref 6–29)
AST: 18 U/L (ref 10–30)
Albumin: 4.2 g/dL (ref 3.6–5.1)
Alkaline phosphatase (APISO): 45 U/L (ref 31–125)
BUN: 10 mg/dL (ref 7–25)
CO2: 25 mmol/L (ref 20–32)
Calcium: 8.7 mg/dL (ref 8.6–10.2)
Chloride: 106 mmol/L (ref 98–110)
Creat: 0.97 mg/dL (ref 0.50–1.10)
GFR, Est African American: 90 mL/min/{1.73_m2} (ref >=60)
GFR, Est Non African American: 77 mL/min/{1.73_m2} (ref >=60)
Globulin: 2.3 g/dL (calc) (ref 1.9–3.7)
Glucose, Bld: 87 mg/dL (ref 65–99)
Potassium: 4.2 mmol/L (ref 3.5–5.3)
Sodium: 138 mmol/L (ref 135–146)
Total Bilirubin: 0.5 mg/dL (ref 0.2–1.2)
Total Protein: 6.5 g/dL (ref 6.1–8.1)

## 2019-04-22 NOTE — Progress Notes (Signed)
CBC and CMP WNL

## 2019-05-24 ENCOUNTER — Other Ambulatory Visit: Payer: Self-pay | Admitting: Rheumatology

## 2019-05-24 DIAGNOSIS — M0579 Rheumatoid arthritis with rheumatoid factor of multiple sites without organ or systems involvement: Secondary | ICD-10-CM

## 2019-05-24 NOTE — Telephone Encounter (Signed)
Last Visit: 04/21/19 Next Visit: 09/21/19 Labs: 04/21/19 WNL  PLQ Eye exam: 10/08/18 WNL   Okay to refill per Dr. Estanislado Pandy

## 2019-05-30 ENCOUNTER — Other Ambulatory Visit: Payer: Self-pay | Admitting: *Deleted

## 2019-05-30 MED ORDER — METHOTREXATE SODIUM 2.5 MG PO TABS: ORAL_TABLET | ORAL | 0 refills | Status: DC

## 2019-05-30 NOTE — Telephone Encounter (Signed)
Refill request received via fax  Last Visit: 04/21/19 Next Visit: 09/21/19 Labs: 04/21/19 WNL   Current Dose per office note on 04/21/19 Methotrexate 10  tablets by mouth every week   Okay to refill per Dr. Estanislado Pandy

## 2019-07-05 ENCOUNTER — Telehealth: Payer: Self-pay | Admitting: *Deleted

## 2019-07-05 DIAGNOSIS — Z79899 Other long term (current) drug therapy: Secondary | ICD-10-CM

## 2019-07-05 NOTE — Telephone Encounter (Signed)
Please add future orders for B12 and folate for further assess.  She should continue taking folic acid 2 mg po daily.

## 2019-07-05 NOTE — Telephone Encounter (Signed)
Received labs results from [facility].  Drawn on 07/03/2019. Reviewed by Hazel Sams, PA-C  MCV 104.0, MCH 33.2, LDL/HDL Ration 3.6.  Patient is on MTX 10 tabs weekly and PLQ 200 mg BID M-F.   Per Hazel Sams, PA-C, Please clarify how much folic acid the patient is taking.   Attempted to contact the patient and left message for patient to call the office.

## 2019-07-05 NOTE — Telephone Encounter (Signed)
Patient returned call and states she is taking Folic Acid 2 mg daily and does not miss any doses.

## 2019-07-05 NOTE — Addendum Note (Signed)
Addended by: Carole Binning on: 07/05/2019 05:01 PM   Modules accepted: Orders

## 2019-07-05 NOTE — Telephone Encounter (Signed)
Future order placed for Vitamin B 12 and folate level. Patient advised to continue Folic Acid 2 mg daily. Patient would like to know when you would like her to come for labs work. Please advise.

## 2019-07-06 ENCOUNTER — Other Ambulatory Visit: Payer: Self-pay

## 2019-07-06 DIAGNOSIS — Z79899 Other long term (current) drug therapy: Secondary | ICD-10-CM

## 2019-07-06 LAB — B12 AND FOLATE PANEL
Folate: >24 ng/mL
Vitamin B-12: 263 pg/mL (ref 200–1100)

## 2019-07-06 NOTE — Telephone Encounter (Signed)
Please advise the patient to come to update lab work this week or next week if possible.

## 2019-07-06 NOTE — Telephone Encounter (Signed)
Left message to advise the patient to come to update lab work this week or next week if possible.

## 2019-07-07 NOTE — Progress Notes (Signed)
Vitamin B12 and folate are WNL.

## 2019-08-28 ENCOUNTER — Other Ambulatory Visit: Payer: Self-pay | Admitting: Rheumatology

## 2019-08-28 DIAGNOSIS — M0579 Rheumatoid arthritis with rheumatoid factor of multiple sites without organ or systems involvement: Secondary | ICD-10-CM

## 2019-08-30 MED ORDER — METHOTREXATE SODIUM 2.5 MG PO TABS: ORAL_TABLET | ORAL | 0 refills | Status: DC

## 2019-08-30 NOTE — Telephone Encounter (Addendum)
Refill request received via fax for MTX  Last Visit: 04/21/19 Next Visit: 09/21/19 Labs: 07/03/2019 MCV 104.0, MCH 33.2, LDL/HDL Ratio 3.6. PLQ Eye exam: 10/08/18 WNL  Current Dose per office note on 04/21/2019: Plaquenil 200 mg 1 tablet twice daily Monday through Friday. Methotrexate 10  tablets by mouth every week  Okay to refill PLQ and MTX?

## 2019-09-08 NOTE — Progress Notes (Signed)
Office Visit Note  Patient: Ruth Wells             Date of Birth: 04-May-1985           MRN: [id number]             PCP: Haywood Pao, MD Referring: Haywood Pao, MD Visit Date: 09/21/2019 Occupation: @GUAROCC @  Subjective:  Medication monitoring   History of Present Illness: Ruth Wells is a 34 y.o. female With history of rheumatoid redness and osteoarthritis.  Patient is taking methotrexate 10 tablets by mouth once weekly, folic acid 2 mg by mouth daily, Plaquenil 200 mg 1 tablet twice daily Monday through Friday.  She denies any recent rheumatoid arthritis flares.  She is not having any joint pain or joint swelling at this time.  She has not had any morning stiffness.  She denies any difficulty with ADLs.  She states that since her last appointment in February she is lost 20 pounds intentionally.  She has eliminated soda and has been trying to eat healthier.  She would like to continue to try to lose about 20 more pounds. Patient denies any recent infections.  She has received both COVID-19 vaccines.  She held methotrexate 1 week after each vaccine.  Activities of Daily Living:  Patient reports morning stiffness for 0  minutes.   Patient Denies nocturnal pain.  Difficulty dressing/grooming: Denies Difficulty climbing stairs: Denies Difficulty getting out of chair: Denies Difficulty using hands for taps, buttons, cutlery, and/or writing: Denies  Review of Systems  Constitutional: Negative for fatigue.  HENT: Negative for mouth sores, mouth dryness and nose dryness.   Eyes: Negative for itching and dryness.  Respiratory: Positive for wheezing. Negative for shortness of breath and difficulty breathing.   Cardiovascular: Negative for chest pain and palpitations.  Gastrointestinal: Negative for blood in stool, constipation and diarrhea.  Endocrine: Negative for increased urination.  Genitourinary: Negative for difficulty urinating.  Musculoskeletal: Negative  for arthralgias, joint pain, joint swelling, myalgias, morning stiffness, muscle tenderness and myalgias.  Skin: Negative for color change, rash, hair loss and redness.  Allergic/Immunologic: Negative for susceptible to infections.  Neurological: Negative for dizziness, numbness, headaches, memory loss and weakness.  Hematological: Positive for bruising/bleeding tendency.  Psychiatric/Behavioral: Negative for confusion.    PMFS History:  Patient Active Problem List   Diagnosis Date Noted  . Primary osteoarthritis of both hands 02/24/2016  . Primary osteoarthritis of both feet 02/24/2016  . High risk medication use 02/15/2016  . Raynaud's syndrome without gangrene 02/15/2016  . Rheumatoid arthritis with rheumatoid factor of multiple sites without organ or systems involvement ([place]) 02/15/2016    Past Medical History:  Diagnosis Date  . Arthritis   . Hypertension     Family History  Problem Relation Age of Onset  . Arthritis Mother    Past Surgical History:  Procedure Laterality Date  . WISDOM TOOTH EXTRACTION     Social History   Social History Narrative  . Not on file   Immunization History  Administered Date(s) Administered  . Pneumococcal Polysaccharide-23 02/26/2016     Objective: Vital Signs: BP 108/77 (BP Location: Left Arm, Patient Position: Sitting, Cuff Size: Normal)   Pulse 96   Resp 15   Ht 5\' 5"  (1.651 m)   Wt 153 lb 12.8 oz (69.8 kg)   BMI 25.59 kg/m    Physical Exam Vitals and nursing note reviewed.  Constitutional:      Appearance: She is well-developed.  HENT:  Head: Normocephalic and atraumatic.  Eyes:     Conjunctiva/sclera: Conjunctivae normal.  Pulmonary:     Effort: Pulmonary effort is normal.  Abdominal:     General: Bowel sounds are normal.     Palpations: Abdomen is soft.  Musculoskeletal:     Cervical back: Normal range of motion.  Lymphadenopathy:     Cervical: No cervical adenopathy.  Skin:    General: Skin is warm and  dry.     Capillary Refill: Capillary refill takes less than 2 seconds.  Neurological:     Mental Status: She is alert and oriented to person, place, and time.  Psychiatric:        Behavior: Behavior normal.      Musculoskeletal Exam: C-spine, thoracic spine, lumbar spine have good range of motion.  No midline spinal tenderness.  No SI joint tenderness.  Shoulder joints, elbow joints, wrist joints, MCPs, PIPs, DIPs have good range of motion with no synovitis.  She has complete fist formation bilaterally.  Hip joints, knee joints, ankle joints, MTPs have good range of motion with no synovitis.  Bilateral knee crepitus.  No warmth or effusion of knee joints noted.  No tenderness or swelling of ankle joints.  CDAI Exam: CDAI Score: 0.2  Patient Global: 1 mm; Provider Global: 1 mm Swollen: 0 ; Tender: 0  Joint Exam 09/21/2019   No joint exam has been documented for this visit   There is currently no information documented on the homunculus. Go to the Rheumatology activity and complete the homunculus joint exam.  Investigation: No additional findings.  Imaging: No results found.  Recent Labs: Lab Results  Component Value Date   WBC 5.9 04/21/2019   HGB 14.8 04/21/2019   PLT 218 04/21/2019   NA 138 04/21/2019   K 4.2 04/21/2019   CL 106 04/21/2019   CO2 25 04/21/2019   GLUCOSE 87 04/21/2019   BUN 10 04/21/2019   CREATININE 0.97 04/21/2019   BILITOT 0.5 04/21/2019   ALKPHOS 42 10/07/2016   AST 18 04/21/2019   ALT 15 04/21/2019   PROT 6.5 04/21/2019   ALBUMIN 4.0 10/07/2016   CALCIUM 8.7 04/21/2019   GFRAA 90 04/21/2019    Speciality Comments: PLQ Eye exam: 10/08/18 WNL At [facility] Opthalmology Follow up in 1 year  Procedures:  No procedures performed Allergies: Patient has no known allergies.   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  She has not had any recent rheumatoid arthritis flares.  She is clinically doing well on methotrexate 10 tablets by mouth once weekly, folic acid 2 mg by mouth daily, Plaquenil 200 mg 1 tablet by mouth twice daily Monday through Friday.  She is not experiencing any joint pain, joint swelling, morning stiffness, nocturnal pain at this time.  She has not had any difficulty with ADLs.  She has had 20 pounds of intentional weight loss since her last visit in February 2021.  She plans on continuing to try to lose weight.  She will continue on methotrexate and Plaquenil as prescribed.  She does not need any refills at this time.  She was advised to notify us if she develops increased joint pain or joint swelling.  She will follow-up in the office in 5 months.   High risk medication use - Methotrexate 10  tablets by mouth every week, folic acid 2 mg by mouth daily, and Plaquenil 200 mg by mouth twice a day Monday through Friday. Eye exam: 10/08/18.  She has an upcoming PLQ eye exam scheduled.  She is due to update CBC and CMP today.  Orders were released today.  She will return for lab work in October and every 3 months.  Standing orders are in place.    - Plan: CBC with Differential/Platelet, COMPLETE METABOLIC PANEL WITH GFR  She has not had any recent infections.  She was advised to hold methotrexate if she develops any signs or symptoms of an infection and to resume once the infection has completely cleared.  She has received both COVID-19 vaccinations and held methotrexate 1 week after each vaccine dose.  We discussed that if she does contract COVID-19 she is to call our office for further instructions as she will need Covid antibody infusion.  Use of mask, social distancing and hand hygiene was also emphasized as she is immunosuppressed.  Raynaud's syndrome without gangrene: Not currently active.  No digital ulcerations or  signs of gangrene noted.    Primary osteoarthritis of both hands: She is not having any discomfort or joint stiffness in her hands at this time.  She has good ROM of both wrist joints with no tenderness or inflammation.  Joint protection and muscle strengthening were discussed.   Primary osteoarthritis of both feet: She is not having any discomfort in her feet at this time.  She has good ROM of both ankle joints with no tenderness or inflammation.  She is wearing proper fitting shoes.   Trochanteric bursitis, left hip: Resolved.  No tenderness to palpation on exam.   Orders: Orders Placed This Encounter  Procedures  . CBC with Differential/Platelet  . COMPLETE METABOLIC PANEL WITH GFR   No orders of the defined types were placed in this encounter.    Follow-Up Instructions: Return in about 5 months (around 02/21/2020) for Rheumatoid arthritis.   Ofilia Neas, PA-C  Note - This record has been created using Dragon software.  Chart creation errors have been sought, but may not always  have been located. Such creation errors do not reflect on  the standard of medical care.

## 2019-09-21 ENCOUNTER — Encounter: Payer: Self-pay | Admitting: Physician Assistant

## 2019-09-21 ENCOUNTER — Ambulatory Visit: Payer: BC Managed Care – PPO | Admitting: Physician Assistant

## 2019-09-21 ENCOUNTER — Other Ambulatory Visit: Payer: Self-pay

## 2019-09-21 VITALS — BP 108/77 | HR 96 | Resp 15 | Ht 65.0 in | Wt 153.8 lb

## 2019-09-21 DIAGNOSIS — M19042 Primary osteoarthritis, left hand: Secondary | ICD-10-CM

## 2019-09-21 DIAGNOSIS — M19071 Primary osteoarthritis, right ankle and foot: Secondary | ICD-10-CM

## 2019-09-21 DIAGNOSIS — I7300 Raynaud's syndrome without gangrene: Secondary | ICD-10-CM

## 2019-09-21 DIAGNOSIS — M0579 Rheumatoid arthritis with rheumatoid factor of multiple sites without organ or systems involvement: Secondary | ICD-10-CM | POA: Diagnosis not present

## 2019-09-21 DIAGNOSIS — M19041 Primary osteoarthritis, right hand: Secondary | ICD-10-CM

## 2019-09-21 DIAGNOSIS — Z79899 Other long term (current) drug therapy: Secondary | ICD-10-CM | POA: Diagnosis not present

## 2019-09-21 DIAGNOSIS — M19072 Primary osteoarthritis, left ankle and foot: Secondary | ICD-10-CM

## 2019-09-21 DIAGNOSIS — M7062 Trochanteric bursitis, left hip: Secondary | ICD-10-CM

## 2019-09-21 NOTE — Patient Instructions (Signed)
Standing Labs We placed an order today for your standing lab work.   Please have your standing labs drawn in October and every 3 months  If possible, please have your labs drawn 2 weeks prior to your appointment so that the provider can discuss your results at your appointment.  We have open lab daily Monday through Thursday from 8:30-12:30 PM and 1:30-4:30 PM and Friday from 8:30-12:30 PM and 1:30-4:00 PM at the office of Dr. Shaili Deveshwar, [facility].   Please be advised, patients with office appointments requiring lab work will take precedents over walk-in lab work.  If possible, please come for your lab work on Monday and Friday afternoons, as you may experience shorter wait times. The office is located at [address], [address], [place], [place] [address] No appointment is necessary.   Labs are drawn by [facility]. Please bring your co-pay at the time of your lab draw.  You may receive a bill from [facility] for your lab work.  If you wish to have your labs drawn at another location, please call the office 24 hours in advance to send orders.  If you have any questions regarding directions or hours of operation,  please call [phone].   As a reminder, please drink plenty of water prior to coming for your lab work. Thanks!  

## 2019-09-22 LAB — COMPLETE METABOLIC PANEL WITH GFR
AG Ratio: 1.9 (calc) (ref 1.0–2.5)
ALT: 11 U/L (ref 6–29)
AST: 15 U/L (ref 10–30)
Albumin: 4.2 g/dL (ref 3.6–5.1)
Alkaline phosphatase (APISO): 41 U/L (ref 31–125)
BUN: 9 mg/dL (ref 7–25)
CO2: 24 mmol/L (ref 20–32)
Calcium: 8.4 mg/dL — ABNORMAL LOW (ref 8.6–10.2)
Chloride: 105 mmol/L (ref 98–110)
Creat: 1 mg/dL (ref 0.50–1.10)
GFR, Est African American: 86 mL/min/{1.73_m2} (ref >=60)
GFR, Est Non African American: 74 mL/min/{1.73_m2} (ref >=60)
Globulin: 2.2 g/dL (calc) (ref 1.9–3.7)
Glucose, Bld: 86 mg/dL (ref 65–99)
Potassium: 4 mmol/L (ref 3.5–5.3)
Sodium: 138 mmol/L (ref 135–146)
Total Bilirubin: 0.6 mg/dL (ref 0.2–1.2)
Total Protein: 6.4 g/dL (ref 6.1–8.1)

## 2019-09-22 LAB — CBC WITH DIFFERENTIAL/PLATELET
Absolute Monocytes: 310 cells/uL (ref 200–950)
Basophils Absolute: 19 cells/uL (ref 0–200)
Basophils Relative: 0.3 %
Eosinophils Absolute: 37 cells/uL (ref 15–500)
Eosinophils Relative: 0.6 %
HCT: 43 % (ref 35.0–45.0)
Hemoglobin: 14.6 g/dL (ref 11.7–15.5)
Lymphs Abs: 2331 cells/uL (ref 850–3900)
MCH: 34.1 pg — ABNORMAL HIGH (ref 27.0–33.0)
MCHC: 34 g/dL (ref 32.0–36.0)
MCV: 100.5 fL — ABNORMAL HIGH (ref 80.0–100.0)
MPV: 9.9 fL (ref 7.5–12.5)
Monocytes Relative: 5 %
Neutro Abs: 3503 cells/uL (ref 1500–7800)
Neutrophils Relative %: 56.5 %
Platelets: 238 10*3/uL (ref 140–400)
RBC: 4.28 10*6/uL (ref 3.80–5.10)
RDW: 13.6 % (ref 11.0–15.0)
Total Lymphocyte: 37.6 %
WBC: 6.2 10*3/uL (ref 3.8–10.8)

## 2019-09-22 NOTE — Progress Notes (Signed)
MCV and MCH is borderline elevated. Please make sure the patient is taking folic acid 2 mg po daily.  Rest of CBC WNL.  Calcium is low-8.4.  Please advise the patient to increase her calcium intake.  She can also start taking a vitamin D 1,000 units daily supplement.  Rest of CMP WNL.

## 2019-10-11 ENCOUNTER — Encounter: Payer: Self-pay | Admitting: Rheumatology

## 2019-11-09 ENCOUNTER — Other Ambulatory Visit: Payer: Self-pay | Admitting: Rheumatology

## 2019-11-09 DIAGNOSIS — M0579 Rheumatoid arthritis with rheumatoid factor of multiple sites without organ or systems involvement: Secondary | ICD-10-CM

## 2019-11-09 NOTE — Telephone Encounter (Signed)
Last Visit: 09/21/2019 Next Visit: 02/22/2020 Labs: 09/21/2019 MCV and MCH is borderline elevated. Rest of CBC WNL. Calcium is low-8.4. Rest of CMP WNL. 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 Patient states she has had her PLQ eye exam updated and will have the eye doctor send results.   Okay to refill Plaquenil?

## 2019-12-10 ENCOUNTER — Other Ambulatory Visit: Payer: Self-pay | Admitting: Rheumatology

## 2019-12-12 NOTE — Telephone Encounter (Signed)
Last Visit: 09/21/2019 Next Visit: 02/22/2020 Labs: 09/21/2019 MCV and MCH is borderline elevated. Rest of CBC WNL. Calcium is low-8.4. Rest of CMP WNL.  Current Dose per office note on 09/21/2019:  Methotrexate 10 tablets by mouth every week Dx: Rheumatoid arthritis with rheumatoid factor of multiple sites without organ or systems involvement   Okay to refill MTX?

## 2019-12-23 ENCOUNTER — Other Ambulatory Visit: Payer: Self-pay

## 2019-12-23 DIAGNOSIS — Z79899 Other long term (current) drug therapy: Secondary | ICD-10-CM

## 2019-12-24 LAB — CBC WITH DIFFERENTIAL/PLATELET
Absolute Monocytes: 319 cells/uL (ref 200–950)
Basophils Absolute: 10 cells/uL (ref 0–200)
Basophils Relative: 0.2 %
Eosinophils Absolute: 29 cells/uL (ref 15–500)
Eosinophils Relative: 0.6 %
HCT: 40.9 % (ref 35.0–45.0)
Hemoglobin: 13.7 g/dL (ref 11.7–15.5)
Lymphs Abs: 2524 cells/uL (ref 850–3900)
MCH: 33.1 pg — ABNORMAL HIGH (ref 27.0–33.0)
MCHC: 33.5 g/dL (ref 32.0–36.0)
MCV: 98.8 fL (ref 80.0–100.0)
MPV: 10.3 fL (ref 7.5–12.5)
Monocytes Relative: 6.5 %
Neutro Abs: 2019 cells/uL (ref 1500–7800)
Neutrophils Relative %: 41.2 %
Platelets: 217 10*3/uL (ref 140–400)
RBC: 4.14 10*6/uL (ref 3.80–5.10)
RDW: 13.1 % (ref 11.0–15.0)
Total Lymphocyte: 51.5 %
WBC: 4.9 10*3/uL (ref 3.8–10.8)

## 2019-12-24 LAB — COMPLETE METABOLIC PANEL WITH GFR
AG Ratio: 1.8 (calc) (ref 1.0–2.5)
ALT: 11 U/L (ref 6–29)
AST: 15 U/L (ref 10–30)
Albumin: 4 g/dL (ref 3.6–5.1)
Alkaline phosphatase (APISO): 37 U/L (ref 31–125)
BUN: 12 mg/dL (ref 7–25)
CO2: 25 mmol/L (ref 20–32)
Calcium: 8.2 mg/dL — ABNORMAL LOW (ref 8.6–10.2)
Chloride: 107 mmol/L (ref 98–110)
Creat: 1.06 mg/dL (ref 0.50–1.10)
GFR, Est African American: 80 mL/min/{1.73_m2} (ref >=60)
GFR, Est Non African American: 69 mL/min/{1.73_m2} (ref >=60)
Globulin: 2.2 g/dL (calc) (ref 1.9–3.7)
Glucose, Bld: 65 mg/dL (ref 65–99)
Potassium: 4.2 mmol/L (ref 3.5–5.3)
Sodium: 140 mmol/L (ref 135–146)
Total Bilirubin: 0.4 mg/dL (ref 0.2–1.2)
Total Protein: 6.2 g/dL (ref 6.1–8.1)

## 2019-12-26 NOTE — Progress Notes (Signed)
Calcium is low-8.2.  Please advise the patient to take a vitamin D supplement 2,000 units daily and increase calcium intake.  Rest of CMP WNL.  CBC WNL.

## 2020-02-08 NOTE — Progress Notes (Signed)
Office Visit Note  Patient: Ruth Wells             Date of Birth: September 24, 1985           MRN: [id number]             PCP: Haywood Pao, MD Referring: Haywood Pao, MD Visit Date: 02/22/2020 Occupation: @GUAROCC @  Subjective:  Medication monitoring   History of Present Illness: Ruth Wells is a 34 y.o. female with history of seropositive rheumatoid arthritis and osteoarthritis.  She is taking methotrexate 5 tablets by mouth twice weekly, folic acid 2 mg by mouth daily, Plaquenil 200 mg 1 tablet by mouth twice daily Monday through Friday.  She is tolerating both medications and has not missed any doses recently.  She denies any recent infections.  She denies any recent rheumatoid arthritis flares.  She states that she occasionally has pain and inflammation in her hands after overuse activities which is rare.  She denies any joint pain or joint swelling at this time.  She denies any difficulty with ADLs.  She is not having any morning stiffness at this time.    Activities of Daily Living:  Patient reports morning stiffness for 0 minutes.   Patient Denies nocturnal pain.  Difficulty dressing/grooming: Denies Difficulty climbing stairs: Denies Difficulty getting out of chair: Denies Difficulty using hands for taps, buttons, cutlery, and/or writing: Denies  Review of Systems  Constitutional: Negative for fatigue.  HENT: Positive for nose dryness. Negative for mouth sores and mouth dryness.   Eyes: Negative for pain, itching and dryness.  Respiratory: Negative for shortness of breath and difficulty breathing.   Cardiovascular: Negative for chest pain and palpitations.  Gastrointestinal: Negative for blood in stool, constipation and diarrhea.  Endocrine: Negative for increased urination.  Genitourinary: Negative for difficulty urinating.  Musculoskeletal: Negative for arthralgias, joint pain, joint swelling, myalgias, morning stiffness, muscle tenderness and  myalgias.  Skin: Negative for color change, rash and redness.  Allergic/Immunologic: Negative for susceptible to infections.  Neurological: Negative for dizziness, numbness, headaches, memory loss and weakness.  Hematological: Negative for bruising/bleeding tendency.  Psychiatric/Behavioral: Negative for confusion.    PMFS History:  Patient Active Problem List   Diagnosis Date Noted  . Primary osteoarthritis of both hands 02/24/2016  . Primary osteoarthritis of both feet 02/24/2016  . High risk medication use 02/15/2016  . Raynaud's syndrome without gangrene 02/15/2016  . 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 Arthritis   . Hypertension     Family History  Problem Relation Age of Onset  . Arthritis Mother    Past Surgical History:  Procedure Laterality Date  . WISDOM TOOTH EXTRACTION     Social History   Social History Narrative  . Not on file   Immunization History  Administered Date(s) Administered  . PFIZER SARS-COV-2 Vaccination 05/20/2019, 06/11/2019, 10/14/2019  . Pneumococcal Polysaccharide-23 02/26/2016     Objective: Vital Signs: BP 126/83 (BP Location: Right Arm, Patient Position: Sitting, Cuff Size: Normal)   Pulse 82   Resp 14   Ht 5\' 5"  (1.651 m)   Wt 151 lb 3.2 oz (68.6 kg)   BMI 25.16 kg/m    Physical Exam Vitals and nursing note reviewed.  Constitutional:      Appearance: She is well-developed and well-nourished.  HENT:     Head: Normocephalic and atraumatic.  Eyes:  Extraocular Movements: EOM normal.     Conjunctiva/sclera: Conjunctivae normal.  Cardiovascular:     Pulses: Intact distal pulses.  Pulmonary:     Effort: Pulmonary effort is normal.  Abdominal:     Palpations: Abdomen is soft.  Musculoskeletal:     Cervical back: Normal range of motion.  Skin:    General: Skin is warm and dry.     Capillary  Refill: Capillary refill takes less than 2 seconds.  Neurological:     Mental Status: She is alert and oriented to person, place, and time.  Psychiatric:        Mood and Affect: Mood and affect normal.        Behavior: Behavior normal.      Musculoskeletal Exam: C-spine, thoracic spine, and lumbar spine good ROM. No midline spinal tenderness.  No SI joint tenderness. Shoulder joints, elbow joints, wrist joints, MCPs, PIPs, and DIPs good ROM with no synovitis.  Complete fist formation bilaterally.  Hip joints, knee joints, and ankle joints good ROM with no discomfort.  No warmth or effusion of knee joints.  No tenderness or swelling of ankle joints. No tenderness over trochanteric bursa bilaterally.   CDAI Exam: CDAI Score: 0.2  Patient Global: 1 mm; Provider Global: 1 mm Swollen: 0 ; Tender: 0  Joint Exam 02/22/2020   No joint exam has been documented for this visit   There is currently no information documented on the homunculus. Go to the Rheumatology activity and complete the homunculus joint exam.  Investigation: No additional findings.  Imaging: No results found.  Recent Labs: Lab Results  Component Value Date   WBC 4.9 12/23/2019   HGB 13.7 12/23/2019   PLT 217 12/23/2019   NA 140 12/23/2019   K 4.2 12/23/2019   CL 107 12/23/2019   CO2 25 12/23/2019   GLUCOSE 65 12/23/2019   BUN 12 12/23/2019   CREATININE 1.06 12/23/2019   BILITOT 0.4 12/23/2019   ALKPHOS 42 10/07/2016   AST 15 12/23/2019   ALT 11 12/23/2019   PROT 6.2 12/23/2019   ALBUMIN 4.0 10/07/2016   CALCIUM 8.2 (L) 12/23/2019   GFRAA 80 12/23/2019    Speciality Comments: PLQ Eye exam: 10/11/2019 WNL At [facility] Opthalmology Follow up in 1 year  Procedures:  No procedures performed Allergies: Patient has no known allergies.   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  She has not had any recent rheumatoid arthritis flares.  She is clinically doing well on Methotrexate 5 tablets by mouth twice weekly, folic acid 2 mg by mouth daily, and Plaquenil 200 mg 1 tablet by mouth twice daily Monday through Friday.  She is tolerating these medications without any side effects and has not missed any doses recently.  She is not experiencing any joint pain, morning stiffness, nocturnal pain, or difficulty with ADLs at this time.  She has occasional pain and stiffness in both hands and both wrists with overuse activities.  She will continue on the current treatment regimen.  She was advised to notify us if she develops increased joint pain or joint swelling.  She will follow-up in the office in 5 months.  High risk medication use - Methotrexate 5 tablets by mouth twice weekly, folic acid 2 mg by mouth daily, and Plaquenil 200  mg 1 tablet by mouth twice a day Monday through Friday.  PLQ Eye exam: 10/11/2019 WNL At [facility] Opthalmology Follow up in 1 year.  CBC and CMP were updated on 12/23/2019.  She will be due to update lab work in January 2022 and every 3 months to monitor for drug toxicity.  Standing orders for CBC and CMP remain in place. She has not had any recent infections. She was advised to hold methotrexate if she develops signs or symptoms of an infection and to resume once the infection has completely cleared.  She voiced understanding. She has had a 3 covid-19 vaccinations.  Raynaud's syndrome without gangrene: Not currently active.  She has not had any recurrence of symptoms since moving to [place].  She has no cyanosis, digital ulcerations or signs of gangrene on exam.   Primary osteoarthritis of both hands: No tenderness or inflammation noted on exam.  She is able to make a complete fist bilaterally.  Joint protection and muscle strengthening were discussed.   Primary  osteoarthritis of both feet:  She has good ROM of both ankle joints with no discomfort. She has no discomfort in her feet at this time. She wears proper fitting shoes.    Trochanteric bursitis, left hip: Resolved.  No tenderness to palpation on exam today.   Orders: No orders of the defined types were placed in this encounter.  No orders of the defined types were placed in this encounter.   Follow-Up Instructions: Return in about 5 months (around 07/22/2020) for Rheumatoid arthritis, Osteoarthritis.   Ofilia Neas, PA-C  Note - This record has been created using Dragon software.  Chart creation errors have been sought, but may not always  have been located. Such creation errors do not reflect on  the standard of medical care.

## 2020-02-12 ENCOUNTER — Other Ambulatory Visit: Payer: Self-pay | Admitting: Rheumatology

## 2020-02-22 ENCOUNTER — Encounter: Payer: Self-pay | Admitting: Physician Assistant

## 2020-02-22 ENCOUNTER — Other Ambulatory Visit: Payer: Self-pay

## 2020-02-22 ENCOUNTER — Ambulatory Visit: Payer: BC Managed Care – PPO | Admitting: Physician Assistant

## 2020-02-22 VITALS — BP 126/83 | HR 82 | Resp 14 | Ht 65.0 in | Wt 151.2 lb

## 2020-02-22 DIAGNOSIS — Z79899 Other long term (current) drug therapy: Secondary | ICD-10-CM

## 2020-02-22 DIAGNOSIS — M19042 Primary osteoarthritis, left hand: Secondary | ICD-10-CM

## 2020-02-22 DIAGNOSIS — M0579 Rheumatoid arthritis with rheumatoid factor of multiple sites without organ or systems involvement: Secondary | ICD-10-CM

## 2020-02-22 DIAGNOSIS — M19071 Primary osteoarthritis, right ankle and foot: Secondary | ICD-10-CM

## 2020-02-22 DIAGNOSIS — M19041 Primary osteoarthritis, right hand: Secondary | ICD-10-CM | POA: Diagnosis not present

## 2020-02-22 DIAGNOSIS — M19072 Primary osteoarthritis, left ankle and foot: Secondary | ICD-10-CM

## 2020-02-22 DIAGNOSIS — M7062 Trochanteric bursitis, left hip: Secondary | ICD-10-CM

## 2020-02-22 DIAGNOSIS — I7300 Raynaud's syndrome without gangrene: Secondary | ICD-10-CM | POA: Diagnosis not present

## 2020-02-22 NOTE — Patient Instructions (Signed)
Standing Labs We placed an order today for your standing lab work.   Please have your standing labs drawn in January and every 3 months   If possible, please have your labs drawn 2 weeks prior to your appointment so that the provider can discuss your results at your appointment.  We have open lab daily Monday through Thursday from 8:30-12:30 PM and 1:30-4:30 PM and Friday from 8:30-12:30 PM and 1:30-4:00 PM at the office of Dr. Shaili Deveshwar, [facility].   Please be advised, patients with office appointments requiring lab work will take precedents over walk-in lab work.  If possible, please come for your lab work on Monday and Friday afternoons, as you may experience shorter wait times. The office is located at [address], [address], [place], [place] [address] No appointment is necessary.   Labs are drawn by [facility]. Please bring your co-pay at the time of your lab draw.  You may receive a bill from [facility] for your lab work.  If you wish to have your labs drawn at another location, please call the office 24 hours in advance to send orders.  If you have any questions regarding directions or hours of operation,  please call [phone].   As a reminder, please drink plenty of water prior to coming for your lab work. Thanks!   

## 2020-03-04 ENCOUNTER — Other Ambulatory Visit: Payer: Self-pay | Admitting: Rheumatology

## 2020-03-04 DIAGNOSIS — M0579 Rheumatoid arthritis with rheumatoid factor of multiple sites without organ or systems involvement: Secondary | ICD-10-CM

## 2020-03-05 ENCOUNTER — Telehealth: Payer: Self-pay

## 2020-03-05 MED ORDER — METHOTREXATE SODIUM 2.5 MG PO TABS
ORAL_TABLET | ORAL | 0 refills | Status: DC
Start: 2020-03-05 — End: 2020-06-04

## 2020-03-05 NOTE — Telephone Encounter (Signed)
Patient called requesting prescription refill of Methotrexate and Hydroxychloroquine to be sent to [facility] at [address].

## 2020-03-05 NOTE — Telephone Encounter (Signed)
Last Visit: 02/22/2020 Next Visit: 07/26/2020 Labs: 12/23/2019, Calcium is low-8.2. Rest of CMP WNL. CBC WNL.  Eye exam: 10/11/2019 WNL  Current Dose per office note 02/22/2020, Methotrexate 5 tablets by mouth twice weekly  and Plaquenil 200 mg 1 tablet by mouth twice a day Monday through Friday. [id number] arthritis with rheumatoid factor of multiple sites without organ or systems involvement   Okay to refill Plaquenil  And MTX?

## 2020-03-05 NOTE — Telephone Encounter (Signed)
See rx refill request 

## 2020-03-23 ENCOUNTER — Other Ambulatory Visit: Payer: Self-pay | Admitting: *Deleted

## 2020-03-23 DIAGNOSIS — Z79899 Other long term (current) drug therapy: Secondary | ICD-10-CM

## 2020-03-24 LAB — COMPLETE METABOLIC PANEL WITH GFR
AG Ratio: 2.2 (calc) (ref 1.0–2.5)
ALT: 12 U/L (ref 6–29)
AST: 15 U/L (ref 10–30)
Albumin: 4.3 g/dL (ref 3.6–5.1)
Alkaline phosphatase (APISO): 32 U/L (ref 31–125)
BUN: 12 mg/dL (ref 7–25)
CO2: 27 mmol/L (ref 20–32)
Calcium: 9 mg/dL (ref 8.6–10.2)
Chloride: 107 mmol/L (ref 98–110)
Creat: 0.9 mg/dL (ref 0.50–1.10)
GFR, Est African American: 97 mL/min/{1.73_m2} (ref >=60)
GFR, Est Non African American: 84 mL/min/{1.73_m2} (ref >=60)
Globulin: 2 g/dL (calc) (ref 1.9–3.7)
Glucose, Bld: 67 mg/dL (ref 65–99)
Potassium: 4.2 mmol/L (ref 3.5–5.3)
Sodium: 141 mmol/L (ref 135–146)
Total Bilirubin: 0.6 mg/dL (ref 0.2–1.2)
Total Protein: 6.3 g/dL (ref 6.1–8.1)

## 2020-03-24 LAB — CBC WITH DIFFERENTIAL/PLATELET
Absolute Monocytes: 331 cells/uL (ref 200–950)
Basophils Absolute: 7 cells/uL (ref 0–200)
Basophils Relative: 0.1 %
Eosinophils Absolute: 29 cells/uL (ref 15–500)
Eosinophils Relative: 0.4 %
HCT: 41.7 % (ref 35.0–45.0)
Hemoglobin: 14.5 g/dL (ref 11.7–15.5)
Lymphs Abs: 3038 cells/uL (ref 850–3900)
MCH: 35.1 pg — ABNORMAL HIGH (ref 27.0–33.0)
MCHC: 34.8 g/dL (ref 32.0–36.0)
MCV: 101 fL — ABNORMAL HIGH (ref 80.0–100.0)
MPV: 10 fL (ref 7.5–12.5)
Monocytes Relative: 4.6 %
Neutro Abs: 3794 cells/uL (ref 1500–7800)
Neutrophils Relative %: 52.7 %
Platelets: 222 10*3/uL (ref 140–400)
RBC: 4.13 10*6/uL (ref 3.80–5.10)
RDW: 12.6 % (ref 11.0–15.0)
Total Lymphocyte: 42.2 %
WBC: 7.2 10*3/uL (ref 3.8–10.8)

## 2020-03-26 NOTE — Progress Notes (Signed)
MCV and MCH are mildly elevated. Please make sure the patient is taking folic acid 2 mg daily.  Rest of CBC WNL.  CMP WNL.

## 2020-05-13 ENCOUNTER — Other Ambulatory Visit: Payer: Self-pay | Admitting: Physician Assistant

## 2020-05-13 DIAGNOSIS — M0579 Rheumatoid arthritis with rheumatoid factor of multiple sites without organ or systems involvement: Secondary | ICD-10-CM

## 2020-05-14 NOTE — Telephone Encounter (Signed)
Last Visit: 02/22/2020 Next Visit: 07/26/2020 Labs: 03/23/2020 MCV and MCH are mildly elevated. Please make sure the patient is taking folic acid 2 mg daily. Rest of CBC WNL. CMP WNL.  Eye exam: 10/11/2019  Current Dose per office note on 02/22/2020: Plaquenil 200 mg 1 tablet by mouth twice a day Monday through Friday.  [id number] arthritis with rheumatoid factor of multiple sites without organ or systems involvement   Last Fill: 03/05/2020   Okay to refill Plaquenil?

## 2020-06-04 ENCOUNTER — Other Ambulatory Visit: Payer: Self-pay | Admitting: *Deleted

## 2020-06-04 MED ORDER — METHOTREXATE SODIUM 2.5 MG PO TABS
ORAL_TABLET | ORAL | 0 refills | Status: DC
Start: 2020-06-04 — End: 2020-08-20

## 2020-06-04 NOTE — Telephone Encounter (Signed)
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 Please make sure the patient is taking folic acid 2 mg daily. Rest of CBC WNL. CMP WNL.   Okay to refill MTX?

## 2020-06-14 ENCOUNTER — Other Ambulatory Visit: Payer: Self-pay | Admitting: *Deleted

## 2020-06-14 DIAGNOSIS — Z79899 Other long term (current) drug therapy: Secondary | ICD-10-CM

## 2020-06-15 LAB — COMPLETE METABOLIC PANEL WITH GFR
AG Ratio: 2.2 (calc) (ref 1.0–2.5)
ALT: 13 U/L (ref 6–29)
AST: 16 U/L (ref 10–30)
Albumin: 4.4 g/dL (ref 3.6–5.1)
Alkaline phosphatase (APISO): 36 U/L (ref 31–125)
BUN: 10 mg/dL (ref 7–25)
CO2: 26 mmol/L (ref 20–32)
Calcium: 8.7 mg/dL (ref 8.6–10.2)
Chloride: 107 mmol/L (ref 98–110)
Creat: 0.86 mg/dL (ref 0.50–1.10)
GFR, Est African American: 103 mL/min/{1.73_m2} (ref >=60)
GFR, Est Non African American: 89 mL/min/{1.73_m2} (ref >=60)
Globulin: 2 g/dL (calc) (ref 1.9–3.7)
Glucose, Bld: 73 mg/dL (ref 65–99)
Potassium: 4.6 mmol/L (ref 3.5–5.3)
Sodium: 140 mmol/L (ref 135–146)
Total Bilirubin: 0.5 mg/dL (ref 0.2–1.2)
Total Protein: 6.4 g/dL (ref 6.1–8.1)

## 2020-06-15 LAB — CBC WITH DIFFERENTIAL/PLATELET
Absolute Monocytes: 377 cells/uL (ref 200–950)
Basophils Absolute: 13 cells/uL (ref 0–200)
Basophils Relative: 0.2 %
Eosinophils Absolute: 20 cells/uL (ref 15–500)
Eosinophils Relative: 0.3 %
HCT: 43.3 % (ref 35.0–45.0)
Hemoglobin: 14.6 g/dL (ref 11.7–15.5)
Lymphs Abs: 2873 cells/uL (ref 850–3900)
MCH: 34 pg — ABNORMAL HIGH (ref 27.0–33.0)
MCHC: 33.7 g/dL (ref 32.0–36.0)
MCV: 100.9 fL — ABNORMAL HIGH (ref 80.0–100.0)
MPV: 10 fL (ref 7.5–12.5)
Monocytes Relative: 5.8 %
Neutro Abs: 3218 cells/uL (ref 1500–7800)
Neutrophils Relative %: 49.5 %
Platelets: 227 10*3/uL (ref 140–400)
RBC: 4.29 10*6/uL (ref 3.80–5.10)
RDW: 12.5 % (ref 11.0–15.0)
Total Lymphocyte: 44.2 %
WBC: 6.5 10*3/uL (ref 3.8–10.8)

## 2020-06-15 NOTE — Progress Notes (Signed)
CBC and CMP WNL

## 2020-07-12 NOTE — Progress Notes (Signed)
Office Visit Note  Patient: Ruth Wells             Date of Birth: 23-Mar-1985           MRN: [id number]             PCP: Haywood Pao, MD Referring: Haywood Pao, MD Visit Date: 07/26/2020 Occupation: @GUAROCC @  Subjective:  Medication Management   History of Present Illness: Ruth Wells is a 35 y.o. female with seropositive rheumatoid arthritis and osteoarthritis.  She states she had a mild flareup about a month ago subsided by itself.  She has been taking her medications on a regular basis.  She denies any joint pain or joint swelling today.  She states she has intermittent discomfort in trochanteric area for which she does do stretching exercises.  She states that she developed an episode of perioral dermatitis induced by UV light.  She has been using sunscreen and also wearing the widebrimmed hat which has been helpful.  Activities of Daily Living:  Patient reports morning stiffness for 0 minutes.   Patient Denies nocturnal pain.  Difficulty dressing/grooming: Denies Difficulty climbing stairs: Denies Difficulty getting out of chair: Denies Difficulty using hands for taps, buttons, cutlery, and/or writing: Denies  Review of Systems  Constitutional: Negative for fatigue.  HENT: Positive for mouth sores. Negative for mouth dryness and nose dryness.        1 sore approximately at the end of March 2022.   Eyes: Negative for pain, itching and dryness.  Respiratory: Negative for shortness of breath and difficulty breathing.   Cardiovascular: Negative for chest pain and palpitations.  Gastrointestinal: Negative for blood in stool, constipation and diarrhea.  Endocrine: Negative for increased urination.  Genitourinary: Negative for difficulty urinating.  Musculoskeletal: Negative for arthralgias, joint pain, joint swelling, myalgias, morning stiffness, muscle tenderness and myalgias.  Skin: Negative for color change, rash and redness.  Allergic/Immunologic:  Negative for susceptible to infections.  Neurological: Negative for dizziness, numbness, headaches, memory loss and weakness.  Hematological: Negative for bruising/bleeding tendency.  Psychiatric/Behavioral: Negative for confusion.    PMFS History:  Patient Active Problem List   Diagnosis Date Noted  . Primary osteoarthritis of both hands 02/24/2016  . Primary osteoarthritis of both feet 02/24/2016  . High risk medication use 02/15/2016  . Raynaud's syndrome without gangrene 02/15/2016  . Rheumatoid arthritis with rheumatoid factor of multiple sites without organ or systems involvement ([place]) 02/15/2016    Past Medical History:  Diagnosis Date  . Arthritis   . Hypertension     Family History  Problem Relation Age of Onset  . Arthritis Mother    Past Surgical History:  Procedure Laterality Date  . WISDOM TOOTH EXTRACTION     Social History   Social History Narrative  . Not on file   Immunization History  Administered Date(s) Administered  . Moderna Sars-Covid-2 Vaccination 04/06/2020  . PFIZER(Purple Top)SARS-COV-2 Vaccination 05/20/2019, 06/11/2019, 10/14/2019  . Pneumococcal Polysaccharide-23 02/26/2016     Objective: Vital Signs: BP 120/83 (BP Location: Right Arm, Patient Position: Sitting, Cuff Size: Normal)   Pulse 89   Resp 13   Ht 5\' 5"  (1.651 m)   Wt 146 lb 9.6 oz (66.5 kg)   BMI 24.40 kg/m    Physical Exam Vitals and nursing note reviewed.  Constitutional:      Appearance: She is well-developed.  HENT:     Head: Normocephalic and atraumatic.  Eyes:     Conjunctiva/sclera: Conjunctivae normal.  Cardiovascular:     Rate and Rhythm: Normal rate and regular rhythm.     Heart sounds: Normal heart sounds.  Pulmonary:     Effort: Pulmonary effort is normal.     Breath sounds: Normal breath sounds.  Abdominal:     General: Bowel sounds are normal.     Palpations: Abdomen is soft.  Musculoskeletal:     Cervical back: Normal range of motion.   Lymphadenopathy:     Cervical: No cervical adenopathy.  Skin:    General: Skin is warm and dry.     Capillary Refill: Capillary refill takes less than 2 seconds.  Neurological:     Mental Status: She is alert and oriented to person, place, and time.  Psychiatric:        Behavior: Behavior normal.      Musculoskeletal Exam: C-spine was in good range of motion.  Shoulder joints, elbow joints, wrist joints, MCPs PIPs and DIPs with good range of motion with no synovitis.  Hip joints, knee joints, ankles, MTPs and PIPs with good range of motion with no synovitis.  CDAI Exam: CDAI Score: 0.2  Patient Global: 1 mm; Provider Global: 1 mm Swollen: 0 ; Tender: 0  Joint Exam 07/26/2020   No joint exam has been documented for this visit   There is currently no information documented on the homunculus. Go to the Rheumatology activity and complete the homunculus joint exam.  Investigation: No additional findings.  Imaging: No results found.  Recent Labs: Lab Results  Component Value Date   WBC 6.5 06/14/2020   HGB 14.6 06/14/2020   PLT 227 06/14/2020   NA 140 06/14/2020   K 4.6 06/14/2020   CL 107 06/14/2020   CO2 26 06/14/2020   GLUCOSE 73 06/14/2020   BUN 10 06/14/2020   CREATININE 0.86 06/14/2020   BILITOT 0.5 06/14/2020   ALKPHOS 42 10/07/2016   AST 16 06/14/2020   ALT 13 06/14/2020   PROT 6.4 06/14/2020   ALBUMIN 4.0 10/07/2016   CALCIUM 8.7 06/14/2020   GFRAA 103 06/14/2020    Speciality Comments: PLQ Eye exam: 10/11/2019 WNL At [facility] Follow up in 1 year  Procedures:  No procedures performed Allergies: Patient has no known allergies.   Assessment / Plan:     Visit Diagnoses: Rheumatoid arthritis with rheumatoid factor of multiple sites without organ or systems involvement ([place]) - Positive RF, negative anti-CCP erosive disease, history of JRA: She is clinically doing well with no synovitis today.  She had a mild flare about a month ago which  subsided by itself.  We discussed possibly reducing methotrexate dose in the future if she becomes asymptomatic for 2 years.  I also discussed obtaining x-rays of her hands and feet to monitor for disease process.  She wants to wait until the follow-up visit.  High risk medication use - Methotrexate 5 tablets by mouth twice weekly, folic acid 2 mg by mouth daily, and Plaquenil 200 mg 1 tablet by mouth twice a day Monday through Friday.  Her labs have been stable.  Her last eye examination was October 11, 2019.  She will schedule an eye examination in August.  She has been advised to get labs every 3 months.  Instructions regarding immunization were placed in the AVS.  She was also advised to stop methotrexate in case she develops an infection.  The instructions were placed in the AVS.  Primary osteoarthritis of both hands-joint protection muscle strengthening was discussed.  Trochanteric bursitis, left  hip-she has intermittent discomfort for which she does do stretching exercises.  Primary osteoarthritis of both feet-she currently does not have much discomfort.  Raynaud's syndrome without gangrene-she states she had only 2 episodes of Raynauds in her lifetime when she was living in [place].  She has not had any recurrence of Raynauds since then.  Orders: No orders of the defined types were placed in this encounter.  No orders of the defined types were placed in this encounter.    Follow-Up Instructions: Return in about 5 months (around 12/26/2020) for Rheumatoid arthritis.   Bo Merino, MD  Note - This record has been created using Editor, commissioning.  Chart creation errors have been sought, but may not always  have been located. Such creation errors do not reflect on  the standard of medical care.

## 2020-07-26 ENCOUNTER — Other Ambulatory Visit: Payer: Self-pay

## 2020-07-26 ENCOUNTER — Encounter: Payer: Self-pay | Admitting: Rheumatology

## 2020-07-26 ENCOUNTER — Ambulatory Visit: Payer: BC Managed Care – PPO | Admitting: Rheumatology

## 2020-07-26 VITALS — BP 120/83 | HR 89 | Resp 13 | Ht 65.0 in | Wt 146.6 lb

## 2020-07-26 DIAGNOSIS — Z79899 Other long term (current) drug therapy: Secondary | ICD-10-CM

## 2020-07-26 DIAGNOSIS — M19041 Primary osteoarthritis, right hand: Secondary | ICD-10-CM

## 2020-07-26 DIAGNOSIS — I7300 Raynaud's syndrome without gangrene: Secondary | ICD-10-CM

## 2020-07-26 DIAGNOSIS — M0579 Rheumatoid arthritis with rheumatoid factor of multiple sites without organ or systems involvement: Secondary | ICD-10-CM | POA: Diagnosis not present

## 2020-07-26 DIAGNOSIS — M19071 Primary osteoarthritis, right ankle and foot: Secondary | ICD-10-CM

## 2020-07-26 DIAGNOSIS — M19072 Primary osteoarthritis, left ankle and foot: Secondary | ICD-10-CM

## 2020-07-26 DIAGNOSIS — M19042 Primary osteoarthritis, left hand: Secondary | ICD-10-CM

## 2020-07-26 DIAGNOSIS — M7062 Trochanteric bursitis, left hip: Secondary | ICD-10-CM | POA: Diagnosis not present

## 2020-07-26 NOTE — Patient Instructions (Signed)
Standing Labs We placed an order today for your standing lab work.   Please have your standing labs drawn in July and every 3 months  If possible, please have your labs drawn 2 weeks prior to your appointment so that the provider can discuss your results at your appointment.  Please note that you may see your imaging and lab results in [facility] before we have reviewed them. We may be awaiting multiple results to interpret others before contacting you. Please allow our office up to 72 hours to thoroughly review all of the results before contacting the office for clarification of your results.  We have open lab daily: Monday through Thursday from 1:30-4:30 PM and Friday from 1:30-4:00 PM at the office of Dr. Bo Merino, [facility].   Please be advised, all patients with office appointments requiring lab work will take precedent over walk-in lab work.  If possible, please come for your lab work on Monday and Friday afternoons, as you may experience shorter wait times. The office is located at [address] [place], [place], [place] [address] No appointment is necessary.   Labs are drawn by [facility]. Please bring your co-pay at the time of your lab draw.  You may receive a bill from [facility] for your lab work.  If you wish to have your labs drawn at another location, please call the office 24 hours in advance to send orders.  If you have any questions regarding directions or hours of operation,  please call [phone].   As a reminder, please drink plenty of water prior to coming for your lab work. Thanks!   Vaccines You are taking a medication(s) that can suppress your immune system.  The following immunizations are recommended: . Flu annually . Covid-19  . Td/Tdap (tetanus, diphtheria, pertussis) every 10 years . Pneumonia (Prevnar 15 then Pneumovax 23 at least 1 year apart.  Alternatively, can take Prevnar 20 without needing additional dose) . Shingrix (after age  9): 2 doses from 4 weeks to 6 months apart  Please check with your PCP to make sure you are up to date.  If you test POSITIVE for COVID19 and have MILD to MODERATE symptoms: o First, call your PCP if you would like to receive COVID19 treatment AND o Hold your medications during the infection and for at least 1 week after your symptoms have resolved: - Injectable medication (Benlysta, Cimzia, Cosentyx, Enbrel, Humira, Orencia, Remicade, Simponi, Stelara, Taltz, Tremfya) - Methotrexate - Leflunomide (Arava) - Mycophenolate (Cellcept) - Morrie Sheldon, Olumiant, or Rinvoq o If you take Actemra or Kevzara, you DO NOT need to hold these for COVID19 infection.  If you test POSITIVE for COVID19 and have NO symptoms: o First, call your PCP if you would like to receive COVID19 treatment AND o Hold your medications for at least 10 days after the day that you tested positive - Injectable medication (Benlysta, Cimzia, Cosentyx, Enbrel, Humira, Orencia, Remicade, Simponi, Stelara, Taltz, Tremfya) - Methotrexate - Leflunomide (Arava) - Mycophenolate (Cellcept) - Morrie Sheldon, Olumiant, or Rinvoq o If you take Actemra or Kevzara, you DO NOT need to hold these for COVID19 infection. If you have signs or symptoms of an infection or start antibiotics: . First, call your PCP for workup of your infection. . Hold your medication through the infection, until you complete your antibiotics, and until symptoms resolve if you take the following: o Injectable medication (Actemra, Benlysta, Cimzia, Cosentyx, Enbrel, Humira, Kevzara, Orencia, Remicade, Simponi, Stelara, Taltz, Tremfya) o Methotrexate o Leflunomide (  Arava) o Mycophenolate (Cellcept) o Morrie Sheldon, Olumiant, or Rinvoq  Heart Disease Prevention   Your inflammatory disease increases your risk of heart disease which includes heart attack, stroke, atrial fibrillation (irregular heartbeats), high blood pressure, heart failure and atherosclerosis (plaque in the  arteries).  It is important to reduce your risk by:   . Keep blood pressure, cholesterol, and blood sugar at healthy levels   . Smoking Cessation   . Maintain a healthy weight  o BMI 20-25   . Eat a healthy diet  o Plenty of fresh fruit, vegetables, and whole grains  o Limit saturated fats, foods high in sodium, and added sugars  o DASH and Mediterranean diet   . Increase physical activity  o Recommend moderate physically activity for 150 minutes per week/ 30 minutes a day for five days a week These can be broken up into three separate ten-minute sessions during the day.   . Reduce Stress  . Meditation, slow breathing exercises, yoga, coloring books  . Dental visits twice a year

## 2020-08-19 ENCOUNTER — Other Ambulatory Visit: Payer: Self-pay | Admitting: Physician Assistant

## 2020-08-20 NOTE — Telephone Encounter (Signed)
Next Visit: 12/27/2020  Last Visit: 07/26/2020  Last Fill: 06/04/2020  DX: Rheumatoid arthritis with rheumatoid factor of multiple sites without organ or systems involvement  Current Dose per office note on 07/26/2020: Methotrexate 5 tablets by mouth twice weekly  Labs: 06/14/2020 CBC and CMP WNL  Okay to refill methotrexate?

## 2020-08-22 ENCOUNTER — Other Ambulatory Visit: Payer: Self-pay | Admitting: *Deleted

## 2020-08-22 DIAGNOSIS — M0579 Rheumatoid arthritis with rheumatoid factor of multiple sites without organ or systems involvement: Secondary | ICD-10-CM

## 2020-08-22 MED ORDER — HYDROXYCHLOROQUINE SULFATE 200 MG PO TABS: ORAL_TABLET | ORAL | 0 refills | Status: DC

## 2020-08-22 NOTE — Telephone Encounter (Signed)
Next Visit: 12/27/2020   Last Visit: 07/26/2020  Labs: 06/14/2020 CBC and CMP WNL  PLQ Eye exam: 10/11/2019 WNL    Last Fill: 05/14/2020  Current Dose per office note on 07/26/2020: Plaquenil 200 mg 1 tablet by mouth twice a day Monday through Friday  Dx: Rheumatoid arthritis with rheumatoid factor of multiple sites without organ or systems involvement  Per protocol, okay to refill per Dr. Estanislado Pandy

## 2020-09-28 ENCOUNTER — Other Ambulatory Visit: Payer: Self-pay | Admitting: *Deleted

## 2020-09-28 DIAGNOSIS — Z79899 Other long term (current) drug therapy: Secondary | ICD-10-CM

## 2020-09-29 LAB — CBC WITH DIFFERENTIAL/PLATELET
Absolute Monocytes: 297 cells/uL (ref 200–950)
Basophils Absolute: 13 cells/uL (ref 0–200)
Basophils Relative: 0.2 %
Eosinophils Absolute: 20 cells/uL (ref 15–500)
Eosinophils Relative: 0.3 %
HCT: 40.6 % (ref 35.0–45.0)
Hemoglobin: 13.8 g/dL (ref 11.7–15.5)
Lymphs Abs: 2633 cells/uL (ref 850–3900)
MCH: 34.2 pg — ABNORMAL HIGH (ref 27.0–33.0)
MCHC: 34 g/dL (ref 32.0–36.0)
MCV: 100.7 fL — ABNORMAL HIGH (ref 80.0–100.0)
MPV: 10.3 fL (ref 7.5–12.5)
Monocytes Relative: 4.5 %
Neutro Abs: 3637 cells/uL (ref 1500–7800)
Neutrophils Relative %: 55.1 %
Platelets: 204 10*3/uL (ref 140–400)
RBC: 4.03 10*6/uL (ref 3.80–5.10)
RDW: 12.2 % (ref 11.0–15.0)
Total Lymphocyte: 39.9 %
WBC: 6.6 10*3/uL (ref 3.8–10.8)

## 2020-09-29 LAB — COMPLETE METABOLIC PANEL WITH GFR
AG Ratio: 2 (calc) (ref 1.0–2.5)
ALT: 10 U/L (ref 6–29)
AST: 17 U/L (ref 10–30)
Albumin: 4.1 g/dL (ref 3.6–5.1)
Alkaline phosphatase (APISO): 36 U/L (ref 31–125)
BUN: 10 mg/dL (ref 7–25)
CO2: 25 mmol/L (ref 20–32)
Calcium: 8.5 mg/dL — ABNORMAL LOW (ref 8.6–10.2)
Chloride: 107 mmol/L (ref 98–110)
Creat: 0.84 mg/dL (ref 0.50–0.97)
Globulin: 2.1 g/dL (calc) (ref 1.9–3.7)
Glucose, Bld: 68 mg/dL (ref 65–99)
Potassium: 3.9 mmol/L (ref 3.5–5.3)
Sodium: 141 mmol/L (ref 135–146)
Total Bilirubin: 0.5 mg/dL (ref 0.2–1.2)
Total Protein: 6.2 g/dL (ref 6.1–8.1)
eGFR: 93 mL/min/{1.73_m2} (ref >=60)

## 2020-10-01 NOTE — Progress Notes (Signed)
MCV and MCH remain borderline elevated but stable. Rest of CBC WNL. Calcium is borderline low-8.5.  Please advise the patient to increase dietary calcium. Rest of CMP WNL.  We will continue to monitor lab work every 3 months.

## 2020-11-10 ENCOUNTER — Other Ambulatory Visit: Payer: Self-pay | Admitting: Physician Assistant

## 2020-11-11 ENCOUNTER — Other Ambulatory Visit: Payer: Self-pay | Admitting: Rheumatology

## 2020-11-12 NOTE — Telephone Encounter (Signed)
Next Visit: 12/27/2020  Last Visit: 07/26/2020  Last Fill: 08/20/2020  Dx: Rheumatoid arthritis with rheumatoid factor of multiple sites without organ or systems involvement   Current Dose per office note on 07/26/2020: Methotrexate 5 tablets by mouth twice weekly  Labs: 09/28/2020 MCV and MCH remain borderline elevated but stable. Rest of CBC WNL. Calcium is borderline low-8.5. Rest of CMP WNL.  Okay to refill MTX?

## 2020-11-12 NOTE — Telephone Encounter (Signed)
Next Visit: 12/27/2020  Last Visit: 07/26/2020  Last Fill: 11/09/2019  Dx: Rheumatoid arthritis with rheumatoid factor of multiple sites without organ or systems involvement   Current Dose per office note on [id number]: folic acid 2 mg by mouth daily  Okay to refill Folic Acid?

## 2020-11-15 ENCOUNTER — Other Ambulatory Visit: Payer: Self-pay | Admitting: Rheumatology

## 2020-11-15 DIAGNOSIS — M0579 Rheumatoid arthritis with rheumatoid factor of multiple sites without organ or systems involvement: Secondary | ICD-10-CM

## 2020-11-15 NOTE — Telephone Encounter (Signed)
Next Visit: 12/27/2020   Last Visit: 07/26/2020   Labs: 09/28/2020 MCV and MCH remain borderline elevated but stable. Rest of CBC WNL. Calcium is borderline low-8.5. Rest of CMP WNL   PLQ Eye exam: 10/12/2020 WNL    Last Fill: 08/22/2020   Current Dose per office note on 07/26/2020: Plaquenil 200 mg 1 tablet by mouth twice a day Monday through Friday   Dx: Rheumatoid arthritis with rheumatoid factor of multiple sites without organ or systems involvement  Okay to refill PLQ?

## 2020-12-13 NOTE — Progress Notes (Deleted)
Office Visit Note  Patient: Ruth Wells             Date of Birth: [birth year]           MRN: [id number]             PCP: Haywood Pao, MD Referring: Haywood Pao, MD Visit Date: 12/27/2020 Occupation: @GUAROCC @  Subjective:  No chief complaint on file.   History of Present Illness: Ruth Wells is a 35 y.o. female ***   Activities of Daily Living:  Patient reports morning stiffness for *** {minute/hour:19697}.   Patient {ACTIONS;DENIES/REPORTS:21021675::"Denies"} nocturnal pain.  Difficulty dressing/grooming: {ACTIONS;DENIES/REPORTS:21021675::"Denies"} Difficulty climbing stairs: {ACTIONS;DENIES/REPORTS:21021675::"Denies"} Difficulty getting out of chair: {ACTIONS;DENIES/REPORTS:21021675::"Denies"} Difficulty using hands for taps, buttons, cutlery, and/or writing: {ACTIONS;DENIES/REPORTS:21021675::"Denies"}  No Rheumatology ROS completed.   PMFS History:  Patient Active Problem List   Diagnosis Date Noted  . Primary osteoarthritis of both hands 02/24/2016  . Primary osteoarthritis of both feet 02/24/2016  . High risk medication use 02/15/2016  . Raynaud's syndrome without gangrene 02/15/2016  . Rheumatoid arthritis with rheumatoid factor of multiple sites without organ or systems involvement ([place]) 02/15/2016    Past Medical History:  Diagnosis Date  . Arthritis   . Hypertension     Family History  Problem Relation Age of Onset  . Arthritis Mother    Past Surgical History:  Procedure Laterality Date  . WISDOM TOOTH EXTRACTION     Social History   Social History Narrative  . Not on file   Immunization History  Administered Date(s) Administered  . Moderna Sars-Covid-2 Vaccination 04/06/2020  . PFIZER(Purple Top)SARS-COV-2 Vaccination 05/20/2019, 06/11/2019, 10/14/2019  . Pneumococcal Polysaccharide-23 02/26/2016     Objective: Vital Signs: There were no vitals taken for this visit.   Physical Exam   Musculoskeletal Exam:  ***  CDAI Exam: CDAI Score: -- Patient Global: --; Provider Global: -- Swollen: --; Tender: -- Joint Exam 12/27/2020   No joint exam has been documented for this visit   There is currently no information documented on the homunculus. Go to the Rheumatology activity and complete the homunculus joint exam.  Investigation: No additional findings.  Imaging: No results found.  Recent Labs: Lab Results  Component Value Date   WBC 6.6 09/28/2020   HGB 13.8 09/28/2020   PLT 204 09/28/2020   NA 141 09/28/2020   K 3.9 09/28/2020   CL 107 09/28/2020   CO2 25 09/28/2020   GLUCOSE 68 09/28/2020   BUN 10 09/28/2020   CREATININE 0.84 09/28/2020   BILITOT 0.5 09/28/2020   ALKPHOS 42 10/07/2016   AST 17 09/28/2020   ALT 10 09/28/2020   PROT 6.2 09/28/2020   ALBUMIN 4.0 10/07/2016   CALCIUM 8.5 (L) 09/28/2020   GFRAA 103 06/14/2020    Speciality Comments: PLQ Eye exam: 10/12/2020 WNL At [facility] Follow up in 1 year  Procedures:  No procedures performed Allergies: Patient has no known allergies.   Assessment / Plan:     Visit Diagnoses: No diagnosis found.  Orders: No orders of the defined types were placed in this encounter.  No orders of the defined types were placed in this encounter.   Face-to-face time spent with patient was *** minutes. Greater than 50% of time was spent in counseling and coordination of care.  Follow-Up Instructions: No follow-ups on file.   Earnestine Mealing, CMA  Note - This record has been created using Editor, commissioning.  Chart creation errors have been sought, but may not  always  have been located. Such creation errors do not reflect on  the standard of medical care.

## 2020-12-26 NOTE — Progress Notes (Signed)
Office Visit Note  Patient: Ruth Wells             Date of Birth: 1985-09-21           MRN: [id number]             PCP: Haywood Pao, MD Referring: Haywood Pao, MD Visit Date: 01/09/2021 Occupation: @GUAROCC @  Subjective:  Medication monitoring    History of Present Illness: Ruth Wells is a 35 y.o. female with history of seropositive rheumatoid arthritis and osteoarthritis.  She is currently taking methotrexate 5 tables by mouth twice weekly, folic acid 2 mg daily, and plaquenil 200 mg 1 tablet by mouth twice daily Monday through Friday.  She continues to tolerate these medications without any side effects.  Patient reports that she tested positive for COVID-19 on 12/19/2020 and states that that evening she started to have pain and swelling in her right knee.  She states that she took 1 dose of ibuprofen that night as well as the following morning which resolved her discomfort.  She states that she was prescribed paxlovid which resolved her COVID symptoms.  Her energy level has returned to baseline.  She is not experiencing any residual symptoms at this time.  She states that she was off of methotrexate for about 2-1/2 weeks between having her flu vaccine the week prior to getting COVID as well as while taking Paxlovid.  She states she continues on Plaquenil as prescribed.  She has occasional discomfort in her shoulders at night when laying on her sides but denies any pain or stiffness with ROM at this time.  Yesterday she was having pain on the medial aspect of the right ankle which has resolved.  She used voltaren gel last night, which improved her symptoms.       Activities of Daily Living:  Patient reports morning stiffness for 0  none .   Patient Denies nocturnal pain.  Difficulty dressing/grooming: Denies Difficulty climbing stairs: Denies Difficulty getting out of chair: Denies Difficulty using hands for taps, buttons, cutlery, and/or writing:  Denies  Review of Systems  Constitutional:  Positive for fatigue.  HENT:  Negative for mouth dryness.   Eyes:  Negative for dryness.  Respiratory:  Negative for shortness of breath.   Cardiovascular:  Negative for swelling in legs/feet.  Gastrointestinal:  Negative for constipation.  Endocrine: Negative for excessive thirst.  Genitourinary:  Negative for difficulty urinating.  Musculoskeletal:  Positive for joint pain, joint pain and joint swelling.  Skin:  Negative for rash.  Allergic/Immunologic: Negative for susceptible to infections.  Neurological:  Negative for numbness.  Hematological:  Negative for bruising/bleeding tendency.  Psychiatric/Behavioral:  Negative for sleep disturbance.    PMFS History:  Patient Active Problem List   Diagnosis Date Noted   Primary osteoarthritis of both hands 02/24/2016   Primary osteoarthritis of both feet 02/24/2016   High risk medication use 02/15/2016   Raynaud's syndrome without gangrene 02/15/2016   Rheumatoid arthritis with rheumatoid factor of multiple sites without organ or systems involvement ([place]) 02/15/2016    Past Medical History:  Diagnosis Date   Arthritis    Hypertension     Family History  Problem Relation Age of Onset   Arthritis Mother    Past Surgical History:  Procedure Laterality Date   WISDOM TOOTH EXTRACTION     Social History   Social History Narrative   Not on file   Immunization History  Administered Date(s) Administered   [employer]  Vaccination 04/06/2020   PFIZER(Purple Top)SARS-COV-2 Vaccination 05/20/2019, 06/11/2019, 10/14/2019   Pneumococcal Polysaccharide-23 02/26/2016     Objective: Vital Signs: BP 122/76 (BP Location: Left Arm, Patient Position: Sitting, Cuff Size: Normal)   Pulse 92   Resp 14   Ht 5\' 5"  (1.651 m)   Wt 153 lb (69.4 kg)   BMI 25.46 kg/m    Physical Exam Vitals and nursing note reviewed.  Constitutional:      Appearance: She is well-developed.  HENT:      Head: Normocephalic and atraumatic.  Eyes:     Conjunctiva/sclera: Conjunctivae normal.  Pulmonary:     Effort: Pulmonary effort is normal.  Abdominal:     Palpations: Abdomen is soft.  Musculoskeletal:     Cervical back: Normal range of motion.  Skin:    General: Skin is warm and dry.     Capillary Refill: Capillary refill takes less than 2 seconds.  Neurological:     Mental Status: She is alert and oriented to person, place, and time.  Psychiatric:        Behavior: Behavior normal.     Musculoskeletal Exam: C-spine, thoracic spine, lumbar spine good range of motion with no discomfort.  No midline spinal tenderness or SI joint tenderness.  Shoulder joints, elbow joints, wrist joints, MCPs, PIPs, DIPs have good range of motion with no synovitis.  Complete fist formation bilaterally.  Hip joints have good range of motion with no discomfort.  No tenderness over trochanteric bursa bilaterally.  Knee joints have good range of motion with no warmth or effusion.  Ankle joints have good range of motion with no tenderness or joint swelling.  No tenderness over MTP joints.  No evidence of achilles tendonitis or plantar fasciitis.   CDAI Exam: CDAI Score: 0.2  Patient Global: 1 mm; Provider Global: 1 mm Swollen: 0 ; Tender: 0  Joint Exam 01/09/2021   No joint exam has been documented for this visit   There is currently no information documented on the homunculus. Go to the Rheumatology activity and complete the homunculus joint exam.  Investigation: No additional findings.  Imaging: No results found.  Recent Labs: Lab Results  Component Value Date   WBC 6.6 09/28/2020   HGB 13.8 09/28/2020   PLT 204 09/28/2020   NA 141 09/28/2020   K 3.9 09/28/2020   CL 107 09/28/2020   CO2 25 09/28/2020   GLUCOSE 68 09/28/2020   BUN 10 09/28/2020   CREATININE 0.84 09/28/2020   BILITOT 0.5 09/28/2020   ALKPHOS 42 10/07/2016   AST 17 09/28/2020   ALT 10 09/28/2020   PROT 6.2 09/28/2020    ALBUMIN 4.0 10/07/2016   CALCIUM 8.5 (L) 09/28/2020   GFRAA 103 06/14/2020    Speciality Comments: PLQ Eye exam: 10/12/2020 WNL At [facility] Follow up in 1 year  Procedures:  No procedures performed Allergies: Patient has no known allergies.    Assessment / Plan:     Visit Diagnoses: Rheumatoid arthritis with rheumatoid factor of multiple sites without organ or systems involvement ([place]) -  Positive RF, negative anti-CCP erosive disease, history of JRA: She has no joint tenderness or synovitis on examination today.  Overall she has clinically been doing well taking methotrexate 5 tablets twice weekly, folic acid 2 mg by mouth daily, Plaquenil 200 mg 1 tablet by mouth twice daily Monday through Friday as prescribed.  She continues to tolerate these medications without any side effects.  She was recently diagnosed with COVID-19 on 12/19/2020  and was treated with Flovent.  The night that she tested positive she was experiencing pain and swelling in her right knee which resolved after 2 doses of ibuprofen.  She was off of methotrexate for 2-1/2 weeks around that time since resumed methotrexate and folic acid as prescribed.  She has not missed any doses of Plaquenil.  Discussed the importance of joint protection and muscle strengthening.  She does not want to make any medication changes at this time. She will remain on the current treatment regimen.  She was advised to notify us if she develops increased joint pain or joint swelling.  She will follow-up in the office in 5 months.   High risk medication use - Methotrexate 5 tablets by mouth twice weekly, folic acid 2 mg by mouth daily, and Plaquenil 200 mg 1 tablet by mouth twice a day Monday through Friday. CBC and CMP updated on 09/28/20.  She is due to update lab work so orders were released.  Her next lab work will be due in February and every 3 months to monitor for drug toxicity.  Standing orders for CBC and CMP are in place.  PLQ Eye exam:  10/12/2020 WNL At [facility] Follow up in 1 year.  - Plan: COMPLETE METABOLIC PANEL WITH GFR, CBC with Differential/Platelet She was diagnosed with COVID-19 on 12/19/2020 at which time she was prescribed Paxlovid.  Held MTX for 2.5 weeks.  Discussed the importance of holding methotrexate if she develops signs or symptoms of an infection and to resume once the infection has completely cleared.  She has received the annual influenza vaccine.   Primary osteoarthritis of both hands: No tenderness or inflammation on examination.  Complete fist formation bilaterally.  Joint protection and muscle strengthening were discussed.   Trochanteric bursitis, left hip: Resolved.  No tenderness over the left trochanteric bursa on examination today.   Primary osteoarthritis of both feet: Mild tailor bunions noted.  No tenderness over MTP joints. Yesterday was experiencing pain and tenderness over the right talonavicular joint, which has since resolved.  She has no inflammation on examination today.  She was able to bear full weight on the right foot without discomfort.  Discussed the importance of wearing proper fitting shoes.  Raynaud's syndrome without gangrene: Not currently active.  No digital ulcerations or signs of gangrene.  No signs of sclerodactyly noted.   Orders: Orders Placed This Encounter  Procedures   COMPLETE METABOLIC PANEL WITH GFR   CBC with Differential/Platelet   No orders of the defined types were placed in this encounter.   Follow-Up Instructions: Return in about 5 months (around 06/09/2021) for Rheumatoid arthritis, Osteoarthritis.   Ofilia Neas, PA-C  Note - This record has been created using Dragon software.  Chart creation errors have been sought, but may not always  have been located. Such creation errors do not reflect on  the standard of medical care.

## 2020-12-27 ENCOUNTER — Ambulatory Visit: Payer: BC Managed Care – PPO | Admitting: Physician Assistant

## 2020-12-27 DIAGNOSIS — M7062 Trochanteric bursitis, left hip: Secondary | ICD-10-CM

## 2020-12-27 DIAGNOSIS — Z79899 Other long term (current) drug therapy: Secondary | ICD-10-CM

## 2020-12-27 DIAGNOSIS — M19041 Primary osteoarthritis, right hand: Secondary | ICD-10-CM

## 2020-12-27 DIAGNOSIS — M0579 Rheumatoid arthritis with rheumatoid factor of multiple sites without organ or systems involvement: Secondary | ICD-10-CM

## 2020-12-27 DIAGNOSIS — M19071 Primary osteoarthritis, right ankle and foot: Secondary | ICD-10-CM

## 2020-12-27 DIAGNOSIS — I7300 Raynaud's syndrome without gangrene: Secondary | ICD-10-CM

## 2021-01-09 ENCOUNTER — Encounter: Payer: Self-pay | Admitting: Physician Assistant

## 2021-01-09 ENCOUNTER — Other Ambulatory Visit: Payer: Self-pay

## 2021-01-09 ENCOUNTER — Ambulatory Visit: Payer: BC Managed Care – PPO | Admitting: Physician Assistant

## 2021-01-09 VITALS — BP 122/76 | HR 92 | Resp 14 | Ht 65.0 in | Wt 153.0 lb

## 2021-01-09 DIAGNOSIS — M19041 Primary osteoarthritis, right hand: Secondary | ICD-10-CM | POA: Diagnosis not present

## 2021-01-09 DIAGNOSIS — M19072 Primary osteoarthritis, left ankle and foot: Secondary | ICD-10-CM

## 2021-01-09 DIAGNOSIS — Z79899 Other long term (current) drug therapy: Secondary | ICD-10-CM

## 2021-01-09 DIAGNOSIS — M0579 Rheumatoid arthritis with rheumatoid factor of multiple sites without organ or systems involvement: Secondary | ICD-10-CM

## 2021-01-09 DIAGNOSIS — M19071 Primary osteoarthritis, right ankle and foot: Secondary | ICD-10-CM

## 2021-01-09 DIAGNOSIS — M7062 Trochanteric bursitis, left hip: Secondary | ICD-10-CM

## 2021-01-09 DIAGNOSIS — M19042 Primary osteoarthritis, left hand: Secondary | ICD-10-CM

## 2021-01-09 DIAGNOSIS — I7300 Raynaud's syndrome without gangrene: Secondary | ICD-10-CM

## 2021-01-09 NOTE — Patient Instructions (Signed)
Standing Labs We placed an order today for your standing lab work.   Please have your standing labs drawn in February and every 3 months   If possible, please have your labs drawn 2 weeks prior to your appointment so that the provider can discuss your results at your appointment.  Please note that you may see your imaging and lab results in MyChart before we have reviewed them. We may be awaiting multiple results to interpret others before contacting you. Please allow our office up to 72 hours to thoroughly review all of the results before contacting the office for clarification of your results.  We have open lab daily: Monday through Thursday from 1:30-4:30 PM and Friday from 1:30-4:00 PM at the office of Dr. Shaili Deveshwar, [facility].   Please be advised, all patients with office appointments requiring lab work will take precedent over walk-in lab work.  If possible, please come for your lab work on Monday and Friday afternoons, as you may experience shorter wait times. The office is located at [address], [address], [place], [place] [address] No appointment is necessary.   Labs are drawn by [facility]. Please bring your co-pay at the time of your lab draw.  You may receive a bill from [facility] for your lab work.  If you wish to have your labs drawn at another location, please call the office 24 hours in advance to send orders.  If you have any questions regarding directions or hours of operation,  please call [phone].   As a reminder, please drink plenty of water prior to coming for your lab work. Thanks!  

## 2021-01-10 LAB — CBC WITH DIFFERENTIAL/PLATELET
Absolute Monocytes: 340 cells/uL (ref 200–950)
Basophils Absolute: 13 cells/uL (ref 0–200)
Basophils Relative: 0.2 %
Eosinophils Absolute: 57 cells/uL (ref 15–500)
Eosinophils Relative: 0.9 %
HCT: 41.8 % (ref 35.0–45.0)
Hemoglobin: 14.9 g/dL (ref 11.7–15.5)
Lymphs Abs: 2741 cells/uL (ref 850–3900)
MCH: 35.6 pg — ABNORMAL HIGH (ref 27.0–33.0)
MCHC: 35.6 g/dL (ref 32.0–36.0)
MCV: 100 fL (ref 80.0–100.0)
MPV: 10.7 fL (ref 7.5–12.5)
Monocytes Relative: 5.4 %
Neutro Abs: 3150 cells/uL (ref 1500–7800)
Neutrophils Relative %: 50 %
Platelets: 179 10*3/uL (ref 140–400)
RBC: 4.18 10*6/uL (ref 3.80–5.10)
RDW: 11.6 % (ref 11.0–15.0)
Total Lymphocyte: 43.5 %
WBC: 6.3 10*3/uL (ref 3.8–10.8)

## 2021-01-10 LAB — COMPLETE METABOLIC PANEL WITH GFR
AG Ratio: 1.8 (calc) (ref 1.0–2.5)
ALT: 11 U/L (ref 6–29)
AST: 15 U/L (ref 10–30)
Albumin: 4.1 g/dL (ref 3.6–5.1)
Alkaline phosphatase (APISO): 39 U/L (ref 31–125)
BUN/Creatinine Ratio: 12 (calc) (ref 6–22)
BUN: 13 mg/dL (ref 7–25)
CO2: 26 mmol/L (ref 20–32)
Calcium: 8.5 mg/dL — ABNORMAL LOW (ref 8.6–10.2)
Chloride: 104 mmol/L (ref 98–110)
Creat: 1.13 mg/dL — ABNORMAL HIGH (ref 0.50–0.97)
Globulin: 2.3 g/dL (calc) (ref 1.9–3.7)
Glucose, Bld: 69 mg/dL (ref 65–99)
Potassium: 3.8 mmol/L (ref 3.5–5.3)
Sodium: 139 mmol/L (ref 135–146)
Total Bilirubin: 0.4 mg/dL (ref 0.2–1.2)
Total Protein: 6.4 g/dL (ref 6.1–8.1)
eGFR: 65 mL/min/{1.73_m2} (ref >=60)

## 2021-01-10 NOTE — Progress Notes (Signed)
Creatinine is elevated. GFR is WNL.  Please clarify when the patients last dose of ibuprofen was.  She should avoid all NSAIDs.Please also see when her last dose of MTX was.   CBC WNL.

## 2021-02-03 ENCOUNTER — Other Ambulatory Visit: Payer: Self-pay | Admitting: Physician Assistant

## 2021-02-03 DIAGNOSIS — M0579 Rheumatoid arthritis with rheumatoid factor of multiple sites without organ or systems involvement: Secondary | ICD-10-CM

## 2021-02-04 NOTE — Telephone Encounter (Signed)
Next Visit: 06/11/2021  Last Visit: 01/09/2021  Labs: 01/09/2021 Creatinine is elevated. GFR is WNL. CBC WNL.    Eye exam: 10/12/2020 WNL    Current Dose per office note 01/09/2021: Plaquenil 200 mg 1 tablet by mouth twice daily Monday through Friday   DZ:[id number] arthritis with rheumatoid factor of multiple sites without organ or systems involvement  Last Fill: 11/15/2020  Okay to refill Plaquenil?

## 2021-03-12 ENCOUNTER — Other Ambulatory Visit: Payer: Self-pay | Admitting: *Deleted

## 2021-03-12 DIAGNOSIS — Z79899 Other long term (current) drug therapy: Secondary | ICD-10-CM

## 2021-03-12 LAB — CBC WITH DIFFERENTIAL/PLATELET
Absolute Monocytes: 370 cells/uL (ref 200–950)
Basophils Absolute: 13 cells/uL (ref 0–200)
Basophils Relative: 0.2 %
Eosinophils Absolute: 53 cells/uL (ref 15–500)
Eosinophils Relative: 0.8 %
HCT: 43.7 % (ref 35.0–45.0)
Hemoglobin: 15 g/dL (ref 11.7–15.5)
Lymphs Abs: 2468 cells/uL (ref 850–3900)
MCH: 33.5 pg — ABNORMAL HIGH (ref 27.0–33.0)
MCHC: 34.3 g/dL (ref 32.0–36.0)
MCV: 97.5 fL (ref 80.0–100.0)
MPV: 9.7 fL (ref 7.5–12.5)
Monocytes Relative: 5.6 %
Neutro Abs: 3696 cells/uL (ref 1500–7800)
Neutrophils Relative %: 56 %
Platelets: 219 10*3/uL (ref 140–400)
RBC: 4.48 10*6/uL (ref 3.80–5.10)
RDW: 13.5 % (ref 11.0–15.0)
Total Lymphocyte: 37.4 %
WBC: 6.6 10*3/uL (ref 3.8–10.8)

## 2021-03-12 LAB — COMPLETE METABOLIC PANEL WITH GFR
AG Ratio: 1.7 (calc) (ref 1.0–2.5)
ALT: 14 U/L (ref 6–29)
AST: 18 U/L (ref 10–30)
Albumin: 4.3 g/dL (ref 3.6–5.1)
Alkaline phosphatase (APISO): 41 U/L (ref 31–125)
BUN: 10 mg/dL (ref 7–25)
CO2: 26 mmol/L (ref 20–32)
Calcium: 9.2 mg/dL (ref 8.6–10.2)
Chloride: 104 mmol/L (ref 98–110)
Creat: 0.95 mg/dL (ref 0.50–0.97)
Globulin: 2.6 g/dL (calc) (ref 1.9–3.7)
Glucose, Bld: 76 mg/dL (ref 65–99)
Potassium: 4.3 mmol/L (ref 3.5–5.3)
Sodium: 138 mmol/L (ref 135–146)
Total Bilirubin: 0.4 mg/dL (ref 0.2–1.2)
Total Protein: 6.9 g/dL (ref 6.1–8.1)
eGFR: 81 mL/min/{1.73_m2} (ref >=60)

## 2021-03-12 MED ORDER — METHOTREXATE SODIUM 2.5 MG PO TABS: ORAL_TABLET | ORAL | 0 refills | Status: DC

## 2021-03-12 NOTE — Telephone Encounter (Signed)
Refill request receive via fax  Next Visit: 06/11/2021   Last Visit: 01/09/2021   Labs: 01/09/2021 Creatinine is elevated. GFR is WNL. CBC WNL.    Current Dose per office note on 01/09/2021: Methotrexate 5 tablets by mouth twice weekly  Dx: Rheumatoid arthritis with rheumatoid factor of multiple sites without organ or systems involvement   Last Fill: 11/12/2020  Okay to refill MTX?

## 2021-03-12 NOTE — Telephone Encounter (Signed)
Patient advised to have labs update and expressed understanding.

## 2021-03-18 ENCOUNTER — Telehealth: Payer: Self-pay

## 2021-03-18 NOTE — Telephone Encounter (Signed)
Patient left a voicemail checking the status of her prescription refill.  Patient states she came in the office to have labwork last week and was told the refill would be sent to the pharmacy.  Patient states they still haven't received it.

## 2021-03-18 NOTE — Telephone Encounter (Signed)
Patient advised prescription was sent to the pharmacy on 03/12/2021. Patient will contact the pharmacy.

## 2021-05-01 ENCOUNTER — Other Ambulatory Visit: Payer: Self-pay | Admitting: *Deleted

## 2021-05-01 DIAGNOSIS — M0579 Rheumatoid arthritis with rheumatoid factor of multiple sites without organ or systems involvement: Secondary | ICD-10-CM

## 2021-05-01 MED ORDER — HYDROXYCHLOROQUINE SULFATE 200 MG PO TABS: ORAL_TABLET | ORAL | 0 refills | Status: DC

## 2021-05-01 NOTE — Telephone Encounter (Signed)
Refill request received via fax ? ?Next Visit: 06/11/2021 ?  ?Last Visit: 01/09/2021 ?  ?Labs: 03/12/2021 CBC and CMP WNL ? ? PLQ Eye exam: 10/12/2020 WNL ?  ?Current Dose per office note on 01/09/2021: Plaquenil 200 mg 1 tablet by mouth twice a day Monday through Friday. ?  ?Dx: Rheumatoid arthritis with rheumatoid factor of multiple sites without organ or systems involvement  ?  ?Last Fill: 02/04/2021 ? ?Okay to refill PLQ?  ?

## 2021-06-01 ENCOUNTER — Other Ambulatory Visit: Payer: Self-pay | Admitting: Physician Assistant

## 2021-06-03 NOTE — Telephone Encounter (Signed)
Next Visit: 06/19/2021 ? ?Last Visit: 01/09/2021 ? ?Last Fill: 03/12/2021 ? ?DX: Rheumatoid arthritis with rheumatoid factor of multiple sites without organ or systems involvement  ? ?Current Dose per office note on 01/09/2021: Methotrexate 5 tablets by mouth twice weekly ? ?Labs: 03/12/2021  CBC and CMP WNL ? ?Okay to refill methotrexate?  ?

## 2021-06-05 NOTE — Progress Notes (Signed)
? ?Office Visit Note ? ?Patient: Ruth Wells             ?Date of Birth: October 31, 1985           ?MRN: [id number]             ?PCP: Haywood Pao, MD ?Referring: Haywood Pao, MD ?Visit Date: 06/19/2021 ?Occupation: '@GUAROCC'$ @ ? ?Subjective:  ?Intermittent pain and swelling in hands ? ?History of Present Illness: Ruth Wells is a 36 y.o. female with history of seropositive rheumatoid arthritis.  She states she has been having about 1 flare in her hands every month.  She works in an [employer].  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  She states lifting heavy objects causes mostly the flare.  Last flare was a week ago which was in her right hand and lasted for about couple of days and the symptoms resolved.  She has intermittent discomfort in her feet but she has not noticed any joint swelling.  None of the other joints are painful.  She has been taking her medications on a regular basis. ? ?Activities of Daily Living:  ?Patient reports morning stiffness for 0 minutes.   ?Patient Denies nocturnal pain.  ?Difficulty dressing/grooming: Denies ?Difficulty climbing stairs: Denies ?Difficulty getting out of chair: Denies ?Difficulty using hands for taps, buttons, cutlery, and/or writing: Denies ? ?Review of Systems  ?Constitutional:  Negative for fatigue.  ?HENT:  Negative for mouth sores, mouth dryness and nose dryness.   ?Eyes:  Negative for pain, itching and dryness.  ?Respiratory:  Negative for shortness of breath and difficulty breathing.   ?Cardiovascular:  Negative for chest pain and palpitations.  ?Gastrointestinal:  Negative for blood in stool, constipation and diarrhea.  ?Endocrine: Negative for increased urination.  ?Genitourinary:  Negative for difficulty urinating.  ?Musculoskeletal:  Negative for joint pain, joint pain, joint swelling, myalgias, morning stiffness, muscle tenderness and myalgias.  ?Skin:  Negative for color change, rash, redness and  sensitivity to sunlight.  ?Allergic/Immunologic: Negative for susceptible to infections.  ?Neurological:  Negative for dizziness, numbness, headaches, memory loss and weakness.  ?Hematological:  Negative for bruising/bleeding tendency.  ?Psychiatric/Behavioral:  Negative for confusion.   ? ?PMFS History:  ?Patient Active Problem List  ? Diagnosis Date Noted  ? Primary osteoarthritis of both hands 02/24/2016  ? Primary osteoarthritis of both feet 02/24/2016  ? High risk medication use 02/15/2016  ? Raynaud's syndrome without gangrene 02/15/2016  ? Rheumatoid arthritis with rheumatoid factor of multiple sites without organ or systems involvement ([place]) 02/15/2016  ?  ?Past Medical History:  ?Diagnosis Date  ? Arthritis   ? Hypertension   ?  ?Family History  ?Problem Relation Age of Onset  ? Arthritis Mother   ? ?Past Surgical History:  ?Procedure Laterality Date  ? WISDOM TOOTH EXTRACTION    ? ?Social History  ? ?Social History Narrative  ? Not on file  ? ?Immunization History  ?Administered Date(s) Administered  ? [employer] Sars-Covid-2 Vaccination 04/06/2020  ? PFIZER(Purple Top)SARS-COV-2 Vaccination 05/20/2019, 06/11/2019, 10/14/2019  ? Pneumococcal Polysaccharide-23 02/26/2016  ?  ? ?Objective: ?Vital Signs: BP 110/79 (BP Location: Left Arm, Patient Position: Sitting, Cuff Size: Normal)   Pulse 76   Ht '5\' 5"'$  (1.651 m)   Wt 153 lb 9.6 oz (69.7 kg)   BMI 25.56 kg/m?   ? ?Physical Exam ?Vitals and nursing note reviewed.  ?Constitutional:   ?   Appearance: She is well-developed.  ?HENT:  ?  Head: Normocephalic and atraumatic.  ?Eyes:  ?   Conjunctiva/sclera: Conjunctivae normal.  ?Cardiovascular:  ?   Rate and Rhythm: Normal rate and regular rhythm.  ?   Heart sounds: Normal heart sounds.  ?Pulmonary:  ?   Effort: Pulmonary effort is normal.  ?   Breath sounds: Normal breath sounds.  ?Abdominal:  ?   General: Bowel sounds are normal.  ?   Palpations: Abdomen is soft.  ?Musculoskeletal:  ?   Cervical back: Normal  range of motion.  ?Lymphadenopathy:  ?   Cervical: No cervical adenopathy.  ?Skin: ?   General: Skin is warm and dry.  ?   Capillary Refill: Capillary refill takes less than 2 seconds.  ?Neurological:  ?   Mental Status: She is alert and oriented to person, place, and time.  ?Psychiatric:     ?   Behavior: Behavior normal.  ?  ? ?Musculoskeletal Exam: C-spine was in good range of motion.  Shoulder joints, elbow joints, wrist joints, MCPs PIPs and DIPs with good range of motion with no synovitis.  Hip joints, knee joints, ankles, MTPs and PIPs with good range of motion with no tenderness. ? ?CDAI Exam: ?CDAI Score: 0.2  ?Patient Global: 1 mm; Provider Global: 1 mm ?Swollen: 0 ; Tender: 0  ?Joint Exam 06/19/2021  ? ?No joint exam has been documented for this visit  ? ?There is currently no information documented on the homunculus. Go to the Rheumatology activity and complete the homunculus joint exam. ? ?Investigation: ?No additional findings. ? ?Imaging: ?No results found. ? ?Recent Labs: ?Lab Results  ?Component Value Date  ? WBC 6.6 03/12/2021  ? HGB 15.0 03/12/2021  ? PLT 219 03/12/2021  ? NA 138 03/12/2021  ? K 4.3 03/12/2021  ? CL 104 03/12/2021  ? CO2 26 03/12/2021  ? GLUCOSE 76 03/12/2021  ? BUN 10 03/12/2021  ? CREATININE 0.95 03/12/2021  ? BILITOT 0.4 03/12/2021  ? ALKPHOS 42 10/07/2016  ? AST 18 03/12/2021  ? ALT 14 03/12/2021  ? PROT 6.9 03/12/2021  ? ALBUMIN 4.0 10/07/2016  ? CALCIUM 9.2 03/12/2021  ? GFRAA 103 06/14/2020  ? ? ?Speciality Comments: PLQ Eye exam: 10/12/2020 WNL At [facility] Follow up in 1 year ? ?Procedures:  ?No procedures performed ?Allergies: Patient has no known allergies.  ? ?Assessment / Plan:     ?Visit Diagnoses: Rheumatoid arthritis with rheumatoid factor of multiple sites without organ or systems involvement ([facility]) - Positive RF, negative anti-CCP erosive disease, history of JRA: She has been having a flare of rheumatoid arthritis about once a month.  She relates  mostly the flares to lifting heavy objects at work.  At the last flare was about a week ago which lasted for couple of days.  She is been taking methotrexate and hydroxychloroquine on a regular basis.  I did detailed discussion with her about adding the anti-TNF.  I discussed the possible use of Enbrel and Humira.  Indications side effects contraindications were discussed.  I gave her a handout on Humira to review.  We will obtain x-rays and labs today.  X-rays of bilateral hands 2 views were obtained today.  X-rays of bilateral feet 2 views were also obtained today.  X-rays of bilateral hands showed metacarpocarpal, radiocarpal and intercarpal joint space narrowing.  X-rays of bilateral feet showed fifth metacarpal head erosive changes.  No radiographic progression was noted when compared to the x-rays of 2020. ? ?High risk medication use - Methotrexate 5 tablets by  mouth twice weekly, folic acid 2 mg by mouth daily, and Plaquenil 200 mg 1 tablet by mouth twice a day Monday through Friday.  CBC with differential and CMP with GFR on March 12, 2021 were normal.  I gave her information regarding immunization which was placed in the AVS.  She was advised to hold methotrexate in case she develops an infection and resume after the infection resolves. ? ?Primary osteoarthritis of both hands-she has been experiencing pain and discomfort in her hands been having a flare of rheumatoid arthritis about once a week.  We will obtain x-rays of bilateral hands today. ? ?Trochanteric bursitis, left hip - Resolved.  ? ?Primary osteoarthritis of both feet - Mild tailor bunions noted.  She has intermittent discomfort in her feet.  We will obtain x-rays of bilateral feet today. ? ?Raynaud's syndrome without gangrene-she has not had an episode of Raynauds in the last 10 years. ? ?Orders: ?Orders Placed This Encounter  ?Procedures  ? XR Hand 2 View Right  ? XR Hand 2 View Left  ? XR Foot 2 Views Right  ? XR Foot 2 Views Left  ? CBC  with Differential/Platelet  ? COMPLETE METABOLIC PANEL WITH GFR  ? Sedimentation rate  ? ?No orders of the defined types were placed in this encounter. ? ? ? ?Follow-Up Instructions: Return in about 3 months (around 7/26/

## 2021-06-11 ENCOUNTER — Ambulatory Visit: Payer: BC Managed Care – PPO | Admitting: Rheumatology

## 2021-06-19 ENCOUNTER — Ambulatory Visit: Payer: BC Managed Care – PPO | Admitting: Rheumatology

## 2021-06-19 ENCOUNTER — Ambulatory Visit (INDEPENDENT_AMBULATORY_CARE_PROVIDER_SITE_OTHER): Payer: BC Managed Care – PPO

## 2021-06-19 ENCOUNTER — Encounter: Payer: Self-pay | Admitting: Rheumatology

## 2021-06-19 VITALS — BP 110/79 | HR 76 | Ht 65.0 in | Wt 153.6 lb

## 2021-06-19 DIAGNOSIS — M79641 Pain in right hand: Secondary | ICD-10-CM | POA: Diagnosis not present

## 2021-06-19 DIAGNOSIS — Z79899 Other long term (current) drug therapy: Secondary | ICD-10-CM

## 2021-06-19 DIAGNOSIS — M19071 Primary osteoarthritis, right ankle and foot: Secondary | ICD-10-CM

## 2021-06-19 DIAGNOSIS — M0579 Rheumatoid arthritis with rheumatoid factor of multiple sites without organ or systems involvement: Secondary | ICD-10-CM

## 2021-06-19 DIAGNOSIS — I7300 Raynaud's syndrome without gangrene: Secondary | ICD-10-CM

## 2021-06-19 DIAGNOSIS — M19041 Primary osteoarthritis, right hand: Secondary | ICD-10-CM | POA: Diagnosis not present

## 2021-06-19 DIAGNOSIS — M19072 Primary osteoarthritis, left ankle and foot: Secondary | ICD-10-CM

## 2021-06-19 DIAGNOSIS — M7062 Trochanteric bursitis, left hip: Secondary | ICD-10-CM

## 2021-06-19 DIAGNOSIS — M19042 Primary osteoarthritis, left hand: Secondary | ICD-10-CM

## 2021-06-19 NOTE — Patient Instructions (Signed)
Adalimumab Injection ?What is this medication? ?ADALIMUMAB (ay da LIM yoo mab) is used to treat rheumatoid and psoriatic arthritis. It is also used to treat ankylosing spondylitis, Crohn's disease, ulcerative colitis, plaque psoriasis, hidradenitis suppurativa, and uveitis. ?This medicine may be used for other purposes; ask your health care provider or pharmacist if you have questions. ?COMMON BRAND NAME(S): AMJEVITA, Humira ?What should I tell my care team before I take this medication? ?They need to know if you have any of these conditions: ?cancer ?diabetes (high blood sugar) ?having surgery ?heart disease ?hepatitis B ?immune system problems ?infections, such as tuberculosis (TB) or other bacterial, fungal, or viral infections ?multiple sclerosis ?recent or upcoming vaccine ?an unusual reaction to adalimumab, mannitol, latex, rubber, other medicines, foods, dyes, or preservatives ?pregnant or trying to get pregnant ?breast-feeding ?How should I use this medication? ?This medicine is for injection under the skin. You will be taught how to prepare and give it. Take it as directed on the prescription label. Keep taking it unless your health care provider tells you to stop. ?It is important that you put your used needles and syringes in a special sharps container. Do not put them in a trash can. If you do not have a sharps container, call your pharmacist or health care provider to get one. ?This medicine comes with INSTRUCTIONS FOR USE. Ask your pharmacist for directions on how to use this medicine. Read the information carefully. Talk to your pharmacist or health care provider if you have questions. ?A special MedGuide will be given to you by the pharmacist with each prescription and refill. Be sure to read this information carefully each time. ?Talk to your pediatrician regarding the use of this medicine in children. While this drug may be prescribed for children as young as 2 years for selected conditions,  precautions do apply. ?Overdosage: If you think you have taken too much of this medicine contact a poison control center or emergency room at once. ?NOTE: This medicine is only for you. Do not share this medicine with others. ?What if I miss a dose? ?If you miss a dose, take it as soon as you can. If it is almost time for your next dose, take only that dose. Do not take double or extra doses. It is important not to miss any doses. Talk to your health care provider about what to do if you miss a dose. ?What may interact with this medication? ?Do not take this medicine with any of the following medications: ?abatacept ?anakinra ?biologic medicines such as certolizumab, etanercept, golimumab, infliximab ?live virus vaccines ?This medicine may also interact with the following medications: ?cyclosporine ?theophylline ?vaccines ?warfarin ?This list may not describe all possible interactions. Give your health care provider a list of all the medicines, herbs, non-prescription drugs, or dietary supplements you use. Also tell them if you smoke, drink alcohol, or use illegal drugs. Some items may interact with your medicine. ?What should I watch for while using this medication? ?Visit your health care provider for regular checks on your progress. Tell your health care provider if your symptoms do not start to get better or if they get worse. ?You will be tested for tuberculosis (TB) before you start this medicine. If your doctor prescribes any medicine for TB, you should start taking the TB medicine before starting this medicine. Make sure to finish the full course of TB medicine. ?This medicine may increase your risk of getting an infection. Call your health care provider for advice if you  get a fever, chills, sore throat, or other symptoms of a cold or flu. Do not treat yourself. Try to avoid being around people who are sick. ?Talk to your health care provider about your risk of cancer. You may be more at risk for certain  types of cancer if you take this medicine. ?What side effects may I notice from receiving this medication? ?Side effects that you should report to your doctor or health care professional as soon as possible: ?allergic reactions like skin rash, itching or hives, swelling of the face, lips, or tongue ?changes in vision ?chest pain ?dizziness ?heart failure (trouble breathing; fast, irregular heartbeat; sudden weight gain; swelling of the ankles, feet, hands; unusually weak or tired) ?infection (fever, chills, cough, sore throat, pain or trouble passing urine) ?liver injury (dark yellow or brown urine; general ill feeling or flu-like symptoms; loss of appetite, right upper belly pain; unusually weak or tired, yellowing of the eyes or skin) ?lump or swollen lymph nodes on the neck, groin, or underarm area ?muscle weakness ?pain, tingling, numbness in the hands or feet ?red, scaly patches or raised bumps on the skin ?trouble breathing ?unusual bleeding or bruising ?unusually weak or tired ?Side effects that usually do not require medical attention (report to your doctor or health care professional if they continue or are bothersome): ?headache ?nausea ?pain, redness, or irritation at site where injected ?stuffy or runny nose ?This list may not describe all possible side effects. Call your doctor for medical advice about side effects. You may report side effects to FDA at 1-800-FDA-1088. ?Where should I keep my medication? ?Keep out of the reach of children and pets. ?Store in the refrigerator between 2 and 8 degrees C (36 and 46 degrees F). Do not freeze. Keep this medicine in the original packaging until you are ready to take it. Protect from light. Get rid of any unused medicine after the expiration date. ?This medicine may be stored at room temperature for up to 14 days. Keep this medicine in the original packaging. Protect from light. If it is stored at room temperature, get rid of any unused medicine after 14 days  or after it expires, whichever is first. ?To get rid of medicines that are no longer needed or have expired: ?Take the medicine to a medicine take-back program. Check with your pharmacy or law enforcement to find a location. ?If you cannot return the medicine, ask your pharmacist or health care provider how to get rid of this medicine safely. ?NOTE: This sheet is a summary. It may not cover all possible information. If you have questions about this medicine, talk to your doctor, pharmacist, or health care provider. ?? 2023 Elsevier/Gold Standard (2021-01-11 00:00:00) ? ?Standing Labs ?We placed an order today for your standing lab work.  ? ?Please have your standing labs drawn in July and every 3 months ? ?If possible, please have your labs drawn 2 weeks prior to your appointment so that the provider can discuss your results at your appointment. ? ?Please note that you may see your imaging and lab results in [facility] before we have reviewed them. ?We may be awaiting multiple results to interpret others before contacting you. ?Please allow our office up to 72 hours to thoroughly review all of the results before contacting the office for clarification of your results. ? ?We have open lab daily: ?Monday through Thursday from 1:30-4:30 PM and Friday from 1:30-4:00 PM ?at the office of Dr. Bo Merino, [facility].   ?  Please be advised, all patients with office appointments requiring lab work will take precedent over walk-in lab work.  ?If possible, please come for your lab work on Monday and Friday afternoons, as you may experience shorter wait times. ?The office is located at [address], [place], [place], [place] [address] ?No appointment is necessary.   ?Labs are drawn by [facility]. Please bring your co-pay at the time of your lab draw.  You may receive a bill from [facility] for your lab work. ? ?Please note if you are on Hydroxychloroquine and and an order has been placed for a Hydroxychloroquine  level, you will need to have it drawn 4 hours or more after your last dose. ? ?If you wish to have your labs drawn at another location, please call the office 24 hours in advance to send orders. ? ?If you have any

## 2021-06-20 ENCOUNTER — Encounter: Payer: Self-pay | Admitting: Podiatry

## 2021-06-20 ENCOUNTER — Ambulatory Visit: Payer: BC Managed Care – PPO

## 2021-06-20 ENCOUNTER — Ambulatory Visit: Payer: BC Managed Care – PPO | Admitting: Podiatry

## 2021-06-20 DIAGNOSIS — D2371 Other benign neoplasm of skin of right lower limb, including hip: Secondary | ICD-10-CM | POA: Diagnosis not present

## 2021-06-20 DIAGNOSIS — M0630 Rheumatoid nodule, unspecified site: Secondary | ICD-10-CM | POA: Diagnosis not present

## 2021-06-20 DIAGNOSIS — M778 Other enthesopathies, not elsewhere classified: Secondary | ICD-10-CM | POA: Diagnosis not present

## 2021-06-20 DIAGNOSIS — D2372 Other benign neoplasm of skin of left lower limb, including hip: Secondary | ICD-10-CM

## 2021-06-20 LAB — COMPLETE METABOLIC PANEL WITH GFR
AG Ratio: 1.7 (calc) (ref 1.0–2.5)
ALT: 15 U/L (ref 6–29)
AST: 14 U/L (ref 10–30)
Albumin: 4.2 g/dL (ref 3.6–5.1)
Alkaline phosphatase (APISO): 38 U/L (ref 31–125)
BUN: 9 mg/dL (ref 7–25)
CO2: 26 mmol/L (ref 20–32)
Calcium: 8.9 mg/dL (ref 8.6–10.2)
Chloride: 105 mmol/L (ref 98–110)
Creat: 0.96 mg/dL (ref 0.50–0.97)
Globulin: 2.5 g/dL (calc) (ref 1.9–3.7)
Glucose, Bld: 82 mg/dL (ref 65–99)
Potassium: 4.5 mmol/L (ref 3.5–5.3)
Sodium: 139 mmol/L (ref 135–146)
Total Bilirubin: 0.4 mg/dL (ref 0.2–1.2)
Total Protein: 6.7 g/dL (ref 6.1–8.1)
eGFR: 79 mL/min/{1.73_m2} (ref >=60)

## 2021-06-20 LAB — CBC WITH DIFFERENTIAL/PLATELET
Absolute Monocytes: 350 cells/uL (ref 200–950)
Basophils Absolute: 23 cells/uL (ref 0–200)
Basophils Relative: 0.3 %
Eosinophils Absolute: 30 cells/uL (ref 15–500)
Eosinophils Relative: 0.4 %
HCT: 42.8 % (ref 35.0–45.0)
Hemoglobin: 14.6 g/dL (ref 11.7–15.5)
Lymphs Abs: 1786 cells/uL (ref 850–3900)
MCH: 35 pg — ABNORMAL HIGH (ref 27.0–33.0)
MCHC: 34.1 g/dL (ref 32.0–36.0)
MCV: 102.6 fL — ABNORMAL HIGH (ref 80.0–100.0)
MPV: 9.7 fL (ref 7.5–12.5)
Monocytes Relative: 4.6 %
Neutro Abs: 5411 cells/uL (ref 1500–7800)
Neutrophils Relative %: 71.2 %
Platelets: 254 10*3/uL (ref 140–400)
RBC: 4.17 10*6/uL (ref 3.80–5.10)
RDW: 12.5 % (ref 11.0–15.0)
Total Lymphocyte: 23.5 %
WBC: 7.6 10*3/uL (ref 3.8–10.8)

## 2021-06-20 LAB — SEDIMENTATION RATE: Sed Rate: 2 mm/h (ref 0–20)

## 2021-06-20 NOTE — Progress Notes (Signed)
?Subjective:  ?Patient ID: Ruth Wells, female    DOB: 1985-11-03,  MRN: [id number] ?HPI ?Chief Complaint  ?Patient presents with  ? Foot Pain  ?  Sub 5th MPJ left and sub 4th MPJ right - tenderness, enlargements of areas, callusing, questions if any preventative or maintenance  ? New Patient (Initial Visit)  ? ? ?36 y.o. female presents with the above complaint.  ? ?ROS: Denies fever chills nausea vomiting muscle aches pains calf pain back pain chest pain shortness of breath. ? ?She has rheumatoid arthritis is and is seen by Dr.Devashoir.  She states that she has been having flares recently with tenderness of the Achilles as well as the metacarpal phalangeal joints.  She is noticing that she is having more pain beneath the fourth metatarsal phalangeal joint right foot fifth metatarsal phalangeal joint left foot as well as the second metatarsal phalangeal joint left foot. ? ?Past Medical History:  ?Diagnosis Date  ? Arthritis   ? Hypertension   ? ?Past Surgical History:  ?Procedure Laterality Date  ? WISDOM TOOTH EXTRACTION    ? ? ?Current Outpatient Medications:  ?  Acetaminophen (TYLENOL PO), Take by mouth as needed., Disp: , Rfl:  ?  CRYSELLE-28 0.3-30 MG-MCG tablet, , Disp: , Rfl: 0 ?  diclofenac sodium (VOLTAREN) 1 % GEL, Apply topically as needed., Disp: , Rfl:  ?  folic acid (FOLVITE) 1 MG tablet, TAKE 2 TABLETS BY MOUTH DAILY, Disp: 180 tablet, Rfl: 4 ?  hydroxychloroquine (PLAQUENIL) 200 MG tablet, TAKE 1 TABLET TWICE DAILY MONDAY-FRIDAY ONLY, Disp: 120 tablet, Rfl: 0 ?  Loratadine (CLARITIN PO), Take by mouth daily., Disp: , Rfl:  ?  losartan (COZAAR) 100 MG tablet, Take 100 mg by mouth daily., Disp: , Rfl: 1 ?  methotrexate 2.5 MG tablet, TAKE 5 TABLETS BY MOUTH TWICE A WEEK, Disp: 120 tablet, Rfl: 0 ?  metroNIDAZOLE (METROCREAM) 0.75 % cream, APP AA ON THE FACE BID, Disp: , Rfl: 3 ?  VITAMIN D PO, Take 2,000 Units by mouth daily., Disp: , Rfl:  ? ?No Known Allergies ?Review of Systems ?Objective:   ?There were no vitals filed for this visit. ? ?General: Well developed, nourished, in no acute distress, alert and oriented x3  ? ?Dermatological: Skin is warm, dry and supple bilateral. Nails x 10 are well maintained; remaining integument appears unremarkable at this time. There are no open sores, no preulcerative lesions, no rash or signs of infection present.  Reactive hyperkeratosis to the large nonpulsatile nodules on the plantar aspect of the bilateral foot ? ?Vascular: Dorsalis Pedis artery and Posterior Tibial artery pedal pulses are 2/4 bilateral with immedate capillary fill time. Pedal hair growth present. No varicosities and no lower extremity edema present bilateral.  ? ?Neruologic: Grossly intact via light touch bilateral. Vibratory intact via tuning fork bilateral. Protective threshold with Semmes Wienstein monofilament intact to all pedal sites bilateral. Patellar and Achilles deep tendon reflexes 2+ bilateral. No Babinski or clonus noted bilateral.  ? ?Musculoskeletal: No gross boney pedal deformities bilateral. No pain, crepitus, or limitation noted with foot and ankle range of motion bilateral. Muscular strength 5/5 in all groups tested bilateral.  Large nonpulsatile not mobic nodules on the plantar aspect of the bilateral foot fourth metatarsal head right second metatarsal head and fifth metatarsal head left.  They are nonfluctuant nonpulsatile and firm. ? ?Gait: Unassisted, Nonantalgic.  ? ? ?Radiographs: ? ?Radiographs taken today demonstrate osseously mature individual I do not see any breakdown of the joints  as of yet though she does have lateral deviation of the fifth metatarsal left foot which is resulting in dislocation of the fifth toe.  Soft tissue does demonstrate some thickening of the soft tissue and plantar aspect of the foot which I do not believe is pannus but more than likely nodule. ? ?Assessment & Plan:  ? 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 ? ?Plan: Requesting MRI with contrast bilateral for evaluation.  May need to consider steroid injections to help decrease the thickness and size of these if they are truly nodules.  If this is the case then may be we can get her into a accommodative type orthotic or shoe. ? ? ? ? ?Ruth Wells, DPM ?

## 2021-06-20 NOTE — Progress Notes (Signed)
CBC, CMP and sed rate are within normal limits.

## 2021-06-24 ENCOUNTER — Encounter: Payer: Self-pay | Admitting: Rheumatology

## 2021-07-02 ENCOUNTER — Other Ambulatory Visit: Payer: BC Managed Care – PPO

## 2021-07-10 ENCOUNTER — Telehealth: Payer: Self-pay | Admitting: Pharmacist

## 2021-07-10 NOTE — Telephone Encounter (Signed)
Please start Humira BIV. ? ?Dose: '40mg'$  SQ every 14 days ? ?Dx: Rheumatoid arthritis (M05.79) ? ?Current regimen: MTX (started January 2018) + hydroxychloroquine (since 2017) in combination ? ?Previously tried: ?SSZ + hydroxychloroquine - inadequate clinical response ? ?Once approved she is eligible for copay card. ?She will also need consent signed at new start visit. Was discussed in detail at [place] on 06/19/21 but consent was not obtained. Patient wanted to review further before moving forward with biologic.  ? ?Patient also wants to be updated on vaccines prior to starting biologic therapy. ? ?Knox Saliva, PharmD, MPH, BCPS, CPP ?Clinical Pharmacist (Rheumatology and Pulmonology) ?

## 2021-07-10 NOTE — Telephone Encounter (Signed)
Submitted a Prior Authorization request to [facility] for HUMIRA via CoverMyMeds. Will update once we receive a response. ? ?Key: [id number] ? ?Knox Saliva, PharmD, MPH, BCPS, CPP ?Clinical Pharmacist (Rheumatology and Pulmonology) ?

## 2021-07-11 ENCOUNTER — Other Ambulatory Visit (HOSPITAL_COMMUNITY): Payer: Self-pay

## 2021-07-11 NOTE — Telephone Encounter (Signed)
Received notification from [facility] regarding a prior authorization for [employer]. Authorization has been APPROVED from 07/11/21 to 07/12/22.   Unable to run test claim as patient must fill through [facility]: [phone]  Authorization #  [id number]  Patient enrolled into Humira copay card: ID: [id number] GROUP: [id number] BIN: [id number] PCN: [id number]  Will await from patient regarding vaccines and routing to Dr. Estanislado Pandy regarding consent visit.  Knox Saliva, PharmD, MPH, BCPS, CPP Clinical Pharmacist (Rheumatology and Pulmonology)

## 2021-07-11 NOTE — Telephone Encounter (Signed)
Yes, patient wanted to review the information before making the decision.  We will have to schedule an appointment to obtain the consent.

## 2021-07-17 NOTE — Telephone Encounter (Signed)
Attempted to contact and left message for patient to call and reschedule appointment.

## 2021-07-18 ENCOUNTER — Telehealth: Payer: Self-pay | Admitting: Rheumatology

## 2021-07-18 DIAGNOSIS — M0579 Rheumatoid arthritis with rheumatoid factor of multiple sites without organ or systems involvement: Secondary | ICD-10-CM

## 2021-07-18 DIAGNOSIS — Z79899 Other long term (current) drug therapy: Secondary | ICD-10-CM

## 2021-07-18 DIAGNOSIS — Z111 Encounter for screening for respiratory tuberculosis: Secondary | ICD-10-CM

## 2021-07-18 DIAGNOSIS — Z9225 Personal history of immunosupression therapy: Secondary | ICD-10-CM

## 2021-07-18 NOTE — Telephone Encounter (Addendum)
This appointment was not supposed to be scheduled with me. Humira was only briefly discussed at last OV. Appt should be scheduled with Hazel Sams or Dr. Estanislado Pandy in morning when I will be onsite.  She has been on methotrexate so she should have been getting updated TB gold labs.  Per Dr. Arlean Hopping note - "Yes, patient wanted to review the information before making the decision.  We will have to schedule an appointment to obtain the consent."  Seth Bake - can you advise on if this pt has been getting TB gold drawn with CBC/CMP?  Knox Saliva, PharmD, MPH, BCPS, CPP Clinical Pharmacist (Rheumatology and Pulmonology)

## 2021-07-18 NOTE — Telephone Encounter (Signed)
Spoke with patient and rescheduled her appointment to 07/25/2021 at 8:20 am with [facility]. Patient advised she will need to update her labs including a TB Gold. Patient states she just had labs at the end of April 2023. Discussed with Devki and she staets we shoud go ahead and update CBC and CMP as well.

## 2021-07-18 NOTE — Telephone Encounter (Signed)
Patient requested a return call to let her know if she needs her TB gold labwork before her appointment on Wednesday, 07/24/21 with Devki for Humira new start.

## 2021-07-18 NOTE — Progress Notes (Unsigned)
Office Visit Note  Patient: Ruth Wells             Date of Birth: 31-May-1985           MRN: [id number]             PCP: Haywood Pao, MD Referring: Haywood Pao, MD Visit Date: 07/25/2021 Occupation: '@GUAROCC'$ @  Subjective:  Discussed Humira  History of Present Illness: Ruth Wells is a 36 y.o. female with history as there was rheumatoid arthritis and osteoarthritis.  Patient is currently taking methotrexate 5 tablets twice weekly and folic acid 2 mg daily.  She is also taking Plaquenil 200 mg 1 tablet by mouth twice daily Monday through Friday.   Counseled patient that Humira is a TNF blocking agent.  Counseled patient on purpose, proper use, and adverse effects of Humira.  Reviewed the most common adverse effects including infections, headache, and injection site reactions. Discussed that there is the possibility of an increased risk of malignancy including non-melanoma skin cancer but it is not well understood if this increased risk is due to the medication or the disease state.  Advised patient to get yearly dermatology exams due to risk of skin cancer. Counseled patient that Humira should be held prior to scheduled surgery.  Counseled patient to avoid live vaccines while on Humira.  Recommend annual influenza, PCV 15 or PCV20 or Pneumovax 23, and Shingrix as indicated. Reviewed the importance of regular labs while on Humira therapy.  Will monitor CBC and CMP 1 month after starting and then every 3 months routinely thereafter. Will monitor TB gold annually. Standing orders placed.    Provided patient with medication education material and answered all questions.  Patient consented to Humira.  Will upload consent into the media tab.  Reviewed storage instructions of Humira.  Advised initial injection must be administered in office.  Patient verbalized understanding.  Dermatology referral {rheumdermreferral:25575} Dose will be for rheumatoid arthritis Humira 40 mg  every 14 days.  Prescription pending lab results and/or insurance approval.  CBC and CMP drawn on 07/19/2021.  She will require updated CBC and CMP 1 month then every 3 months after starting on Humira. TB gold negative on 07/19/2021. PLQ Eye exam: 10/12/2020 [facility] At [facility] Follow up in 1 year Discussed the importance of holding Humira and methotrexate if she develops signs or symptoms of an infection and to resume once the infection is completely cleared.  Activities of Daily Living:  Patient reports morning stiffness for *** {minute/hour:19697}.   Patient {ACTIONS;DENIES/REPORTS:21021675::"Denies"} nocturnal pain.  Difficulty dressing/grooming: {ACTIONS;DENIES/REPORTS:21021675::"Denies"} Difficulty climbing stairs: {ACTIONS;DENIES/REPORTS:21021675::"Denies"} Difficulty getting out of chair: {ACTIONS;DENIES/REPORTS:21021675::"Denies"} Difficulty using hands for taps, buttons, cutlery, and/or writing: {ACTIONS;DENIES/REPORTS:21021675::"Denies"}  No Rheumatology ROS completed.   PMFS History:  Patient Active Problem List   Diagnosis Date Noted   Primary osteoarthritis of both hands 02/24/2016   Primary osteoarthritis of both feet 02/24/2016   High risk medication use 02/15/2016   Raynaud's syndrome without gangrene 02/15/2016   Rheumatoid arthritis with rheumatoid factor of multiple sites without organ or systems involvement ([place]) 02/15/2016    Past Medical History:  Diagnosis Date   Arthritis    Hypertension     Family History  Problem Relation Age of Onset   Arthritis Mother    Past Surgical History:  Procedure Laterality Date   WISDOM TOOTH EXTRACTION     Social History   Social History Narrative   Not on file   Immunization History  Administered Date(s) Administered  Moderna Sars-Covid-2 Vaccination 04/06/2020   PFIZER(Purple Top)SARS-COV-2 Vaccination 05/20/2019, 06/11/2019, 10/14/2019   Pneumococcal Polysaccharide-23 02/26/2016      Objective: Vital Signs: There were no vitals taken for this visit.   Physical Exam Vitals and nursing note reviewed.  Constitutional:      Appearance: She is well-developed.  HENT:     Head: Normocephalic and atraumatic.  Eyes:     Conjunctiva/sclera: Conjunctivae normal.  Cardiovascular:     Rate and Rhythm: Normal rate and regular rhythm.     Heart sounds: Normal heart sounds.  Pulmonary:     Effort: Pulmonary effort is normal.     Breath sounds: Normal breath sounds.  Abdominal:     General: Bowel sounds are normal.     Palpations: Abdomen is soft.  Musculoskeletal:     Cervical back: Normal range of motion.  Skin:    General: Skin is warm and dry.     Capillary Refill: Capillary refill takes less than 2 seconds.  Neurological:     Mental Status: She is alert and oriented to person, place, and time.  Psychiatric:        Behavior: Behavior normal.     Musculoskeletal Exam: ***  CDAI Exam: CDAI Score: -- Patient Global: --; Provider Global: -- Swollen: --; Tender: -- Joint Exam 07/25/2021   No joint exam has been documented for this visit   There is currently no information documented on the homunculus. Go to the Rheumatology activity and complete the homunculus joint exam.  Investigation: No additional findings.  Imaging: XR Foot 2 Views Left  Result Date: 06/19/2021 Juxta-articular osteopenia was noted.  Possible cystic changes were noted in the second, third and fourth MTP joints.  Erosive changes were noted in the fifth metatarsal head.  No radiographic progression was noted when compared to the x-rays of 2020.  Narrowing of the PIP and DIP joints was noted.  No intertarsal, tibiotalar or subtalar joint space narrowing was noted. Impression: These findings are consistent with erosive rheumatoid arthritis without any radiographic progression.  XR Foot 2 Views Right  Result Date: 06/19/2021 Juxta-articular osteopenia was noted.  Narrowing of PIP and DIP  joints was noted.  Narrowing of fifth MTP joint with erosive changes in the fifth metatarsal head was noted.  No intertarsal, tibiotalar or subtalar joint space narrowing was noted.  No radiographic progression was noted when compared to the x-rays of 2020. Impression: These findings are consistent with erosive rheumatoid arthritis without any radiographic progression.  XR Hand 2 View Left  Result Date: 06/19/2021 Juxta-articular osteopenia was noted.  Minimal PIP narrowing was noted.  Metacarpocarpal, intercarpal and radiocarpal joint space narrowing was noted.  No radiographic progression was noted when compared to the x-rays of 2020. Impression: These findings are consistent with rheumatoid arthritis.  No radiographic progression was noted when compared to the x-rays of 2020.  XR Hand 2 View Right  Result Date: 06/19/2021 Juxta-articular osteopenia was noted.  Minimal PIP narrowing was noted.  Metacarpocarpal, intercarpal and radiocarpal joint space narrowing was noted.  No radiographic progression was noted when compared to the x-rays of 2020. Impression: These findings are consistent with rheumatoid arthritis.  No radiographic progression was noted when compared to the x-rays of 2020.   Recent Labs: Lab Results  Component Value Date   WBC 7.6 06/19/2021   HGB 14.6 06/19/2021   PLT 254 06/19/2021   NA 139 06/19/2021   K 4.5 06/19/2021   CL 105 06/19/2021   CO2 26 06/19/2021  GLUCOSE 82 06/19/2021   BUN 9 06/19/2021   CREATININE 0.96 06/19/2021   BILITOT 0.4 06/19/2021   ALKPHOS 42 10/07/2016   AST 14 06/19/2021   ALT 15 06/19/2021   PROT 6.7 06/19/2021   ALBUMIN 4.0 10/07/2016   CALCIUM 8.9 06/19/2021   GFRAA 103 06/14/2020    Speciality Comments: PLQ Eye exam: 10/12/2020 [facility] At [facility] Follow up in 1 year  Procedures:  No procedures performed Allergies: Patient has no known allergies.   Assessment / Plan:     Visit Diagnoses: Rheumatoid arthritis with  rheumatoid factor of multiple sites without organ or systems involvement ([place])  High risk medication use  Primary osteoarthritis of both hands  Primary osteoarthritis of both feet  Raynaud's syndrome without gangrene  Trochanteric bursitis, left hip  Orders: No orders of the defined types were placed in this encounter.  No orders of the defined types were placed in this encounter.   Face-to-face time spent with patient was *** minutes. Greater than 50% of time was spent in counseling and coordination of care.  Follow-Up Instructions: No follow-ups on file.   Ofilia Neas, PA-C  Note - This record has been created using Dragon software.  Chart creation errors have been sought, but may not always  have been located. Such creation errors do not reflect on  the standard of medical care.

## 2021-07-19 ENCOUNTER — Other Ambulatory Visit: Payer: Self-pay | Admitting: *Deleted

## 2021-07-19 DIAGNOSIS — Z79899 Other long term (current) drug therapy: Secondary | ICD-10-CM

## 2021-07-19 DIAGNOSIS — Z9225 Personal history of immunosupression therapy: Secondary | ICD-10-CM

## 2021-07-19 DIAGNOSIS — Z111 Encounter for screening for respiratory tuberculosis: Secondary | ICD-10-CM

## 2021-07-20 NOTE — Progress Notes (Signed)
CBC and CMP are normal.

## 2021-07-24 ENCOUNTER — Ambulatory Visit: Payer: BC Managed Care – PPO | Admitting: Pharmacist

## 2021-07-24 LAB — COMPLETE METABOLIC PANEL WITH GFR
AG Ratio: 1.9 (calc) (ref 1.0–2.5)
ALT: 19 U/L (ref 6–29)
AST: 18 U/L (ref 10–30)
Albumin: 4.2 g/dL (ref 3.6–5.1)
Alkaline phosphatase (APISO): 32 U/L (ref 31–125)
BUN: 9 mg/dL (ref 7–25)
CO2: 27 mmol/L (ref 20–32)
Calcium: 8.9 mg/dL (ref 8.6–10.2)
Chloride: 106 mmol/L (ref 98–110)
Creat: 0.93 mg/dL (ref 0.50–0.97)
Globulin: 2.2 g/dL (calc) (ref 1.9–3.7)
Glucose, Bld: 87 mg/dL (ref 65–99)
Potassium: 4.1 mmol/L (ref 3.5–5.3)
Sodium: 142 mmol/L (ref 135–146)
Total Bilirubin: 0.4 mg/dL (ref 0.2–1.2)
Total Protein: 6.4 g/dL (ref 6.1–8.1)
eGFR: 82 mL/min/{1.73_m2} (ref >=60)

## 2021-07-24 LAB — QUANTIFERON-TB GOLD PLUS
Mitogen-NIL: >10 IU/mL
NIL: 0.1 IU/mL
QuantiFERON-TB Gold Plus: NEGATIVE
TB1-NIL: 0.05 IU/mL
TB2-NIL: 0 IU/mL

## 2021-07-24 LAB — CBC WITH DIFFERENTIAL/PLATELET
Absolute Monocytes: 271 cells/uL (ref 200–950)
Basophils Absolute: 13 cells/uL (ref 0–200)
Basophils Relative: 0.2 %
Eosinophils Absolute: 32 cells/uL (ref 15–500)
Eosinophils Relative: 0.5 %
HCT: 42.5 % (ref 35.0–45.0)
Hemoglobin: 14.5 g/dL (ref 11.7–15.5)
Lymphs Abs: 2400 cells/uL (ref 850–3900)
MCH: 34.4 pg — ABNORMAL HIGH (ref 27.0–33.0)
MCHC: 34.1 g/dL (ref 32.0–36.0)
MCV: 101 fL — ABNORMAL HIGH (ref 80.0–100.0)
MPV: 9.8 fL (ref 7.5–12.5)
Monocytes Relative: 4.3 %
Neutro Abs: 3585 cells/uL (ref 1500–7800)
Neutrophils Relative %: 56.9 %
Platelets: 243 10*3/uL (ref 140–400)
RBC: 4.21 10*6/uL (ref 3.80–5.10)
RDW: 12.2 % (ref 11.0–15.0)
Total Lymphocyte: 38.1 %
WBC: 6.3 10*3/uL (ref 3.8–10.8)

## 2021-07-24 NOTE — Progress Notes (Signed)
TB Gold is negative.

## 2021-07-25 ENCOUNTER — Ambulatory Visit: Payer: BC Managed Care – PPO | Admitting: Physician Assistant

## 2021-07-25 ENCOUNTER — Encounter: Payer: Self-pay | Admitting: Physician Assistant

## 2021-07-25 VITALS — BP 118/80 | HR 82 | Ht 65.0 in | Wt 155.4 lb

## 2021-07-25 DIAGNOSIS — Z79899 Other long term (current) drug therapy: Secondary | ICD-10-CM | POA: Diagnosis not present

## 2021-07-25 DIAGNOSIS — I7300 Raynaud's syndrome without gangrene: Secondary | ICD-10-CM

## 2021-07-25 DIAGNOSIS — M19041 Primary osteoarthritis, right hand: Secondary | ICD-10-CM

## 2021-07-25 DIAGNOSIS — M19071 Primary osteoarthritis, right ankle and foot: Secondary | ICD-10-CM

## 2021-07-25 DIAGNOSIS — M19072 Primary osteoarthritis, left ankle and foot: Secondary | ICD-10-CM

## 2021-07-25 DIAGNOSIS — M0579 Rheumatoid arthritis with rheumatoid factor of multiple sites without organ or systems involvement: Secondary | ICD-10-CM

## 2021-07-25 DIAGNOSIS — M19042 Primary osteoarthritis, left hand: Secondary | ICD-10-CM

## 2021-07-25 DIAGNOSIS — M7062 Trochanteric bursitis, left hip: Secondary | ICD-10-CM

## 2021-07-25 MED ORDER — HUMIRA (2 PEN) 40 MG/0.4ML ~~LOC~~ AJKT: 40.0000 mg | AUTO-INJECTOR | SUBCUTANEOUS | 0 refills | Status: DC

## 2021-07-25 NOTE — Progress Notes (Signed)
Pharmacy Note  Subjective:   Patient presents to clinic today to receive first dose of Humira for RA. She currently takes methotrexate '10mg'$  weekly and hydroxychloroquine '200mg'$  Monday through Friday only.  Patient running a fever or have signs/symptoms of infection? No  Patient currently on antibiotics for the treatment of infection? No  Patient have any upcoming invasive procedures/surgeries? No  Objective: CMP     Component Value Date/Time   NA 142 07/19/2021 1351   K 4.1 07/19/2021 1351   CL 106 07/19/2021 1351   CO2 27 07/19/2021 1351   GLUCOSE 87 07/19/2021 1351   BUN 9 07/19/2021 1351   CREATININE 0.93 07/19/2021 1351   CALCIUM 8.9 07/19/2021 1351   PROT 6.4 07/19/2021 1351   ALBUMIN 4.0 10/07/2016 0901   AST 18 07/19/2021 1351   ALT 19 07/19/2021 1351   ALKPHOS 42 10/07/2016 0901   BILITOT 0.4 07/19/2021 1351   GFRNONAA 89 06/14/2020 1413   GFRAA 103 06/14/2020 1413    CBC    Component Value Date/Time   WBC 6.3 07/19/2021 1351   RBC 4.21 07/19/2021 1351   HGB 14.5 07/19/2021 1351   HCT 42.5 07/19/2021 1351   PLT 243 07/19/2021 1351   MCV 101.0 (H) 07/19/2021 1351   MCH 34.4 (H) 07/19/2021 1351   MCHC 34.1 07/19/2021 1351   RDW 12.2 07/19/2021 1351   LYMPHSABS 2,400 07/19/2021 1351   MONOABS 390 10/07/2016 0901   EOSABS 32 07/19/2021 1351   BASOSABS 13 07/19/2021 1351    Baseline Immunosuppressant Therapy Labs TB GOLD    Latest Ref Rng & Units 07/19/2021    1:51 PM  Quantiferon TB Gold  Quantiferon TB Gold Plus NEGATIVE NEGATIVE     Hepatitis panel: negative (10/03/15) HIV: negative (10/03/15)  Immunoglobulins   SPEP    Latest Ref Rng & Units 07/19/2021    1:51 PM  Serum Protein Electrophoresis  Total Protein 6.1 - 8.1 g/dL 6.4     G6PD No results found for: G6PDH TPMT No results found for: TPMT   Chest x-ray: 02/26/16 - Normal chest.  Assessment/Plan:  Counseled patient that Humira is a TNF blocking agent.  Counseled patient on purpose,  proper use, and adverse effects of Humira.  Reviewed the most common adverse effects including infections, headache, and injection site reactions. Discussed that there is the possibility of an increased risk of malignancy including non-melanoma skin cancer but it is not well understood if this increased risk is due to the medication or the disease state.  Advised patient to get yearly dermatology exams due to risk of skin cancer. Counseled patient that Humira should be held prior to scheduled surgery.  Counseled patient to avoid live vaccines while on Humira.  Recommend annual influenza, PCV 15 or PCV20 or Pneumovax 23, and Shingrix as indicated.  Reviewed the importance of regular labs while on Humira therapy.  Will monitor CBC and CMP 1 month after starting and then every 3 months routinely thereafter. Will monitor TB gold annually. Standing orders placed.    Provided patient with medication education material and answered all questions.  Patient consented to Humira.  Will upload consent into the media tab.  Reviewed storage instructions of Humira.  Advised initial injection must be administered in office.  Patient verbalized understanding.   Patient sees Dr. Elvera Lennox at [facility] dermatology. She has been advised to ensure she receives yearly skin checks while on TNF inhibitor due to risk for non-melanoma skin cancer.  Demonstrated proper injection technique with  Humira demo device  Patient able to demonstrate proper injection technique using the teach back method.  Patient self injected in the right thigh with:  Sample Medication: Humira '40mg'$ /ml autoinjector pen NDC: [address]-24 Lot: [id number] Expiration: 08/2022  Patient tolerated well.  Observed for 30 mins in office for adverse reaction and none noted.   Patient is to return in 1 month for labs and 6-8 weeks for follow-up appointment.  Standing orders placed.   Humira approved through insurance .   Rx sent to: [facility]:  [phone].  Patient provided with pharmacy phone number and advised to call later this week to schedule shipment to home.  She will continue Humira '40mg'$  SQ every 14 days as prescribed in combination with methotrexate '10mg'$  weekly and hydroxychloroquine '200mg'$  Monday through Friday only.  All questions encouraged and answered.  Instructed patient to call with any further questions or concerns.  Knox Saliva, PharmD, MPH, BCPS Clinical Pharmacist (Rheumatology and Pulmonology)  07/25/2021 8:58 AM

## 2021-07-25 NOTE — Patient Instructions (Signed)
Your next HUMIRA dose is due on 08/08/21, 08/22/21, and every 14 days thereafter  CONTINUE METHOTREXATE and HYDROXYCHLOROQUINE  HOLD HUMIRA and methotrexate if you have signs or symptoms of an infection. You can resume once you feel better or back to your baseline. HOLD HUMIRA and methotrexate if you start antibiotics to treat an infection. HOLD HUMIRA and methotrexate around the time of surgery/procedures. Your surgeon will be able to provide recommendations on when to hold BEFORE and when you are cleared to [place].  Pharmacy information: Your prescription will be shipped from [facility]. Their phone number is [phone] Please call to schedule shipment and confirm address. They will mail your medication to your home.  Cost information: Your copay should be affordable. If you call the pharmacy and it is not affordable, please double-check that they are billing through your copay card as secondary coverage. That copay card information is: ID: [id number] GROUP: [id number] BIN: [id number] PCN: [id number]  Labs are due in 1 month then every 3 months. Lab hours are from Monday to Thursday 1:30-4:30pm and Friday 1:30-4pm. You do not need an appointment if you come for labs during these times.  How to manage an injection site reaction: Remember the 5 C's: COUNTER - leave on the counter at least 30 minutes but up to overnight to bring medication to room temperature. This may help prevent stinging COLD - place something cold (like an ice gel pack or cold water bottle) on the injection site just before cleansing with alcohol. This may help reduce pain CLARITIN - use Claritin (generic name is loratadine) for the first two weeks of treatment or the day of, the day before, and the day after injecting. This will help to minimize injection site reactions CORTISONE CREAM - apply if injection site is irritated and itching CALL ME - if injection site reaction is bigger than the size of your fist,  looks infected, blisters, or if you develop hives

## 2021-07-26 ENCOUNTER — Ambulatory Visit
Admission: RE | Admit: 2021-07-26 | Discharge: 2021-07-26 | Disposition: A | Discharge status: Home / Self Care | Payer: BC Managed Care – PPO | Source: Ambulatory Visit | Attending: Podiatry | Admitting: Podiatry

## 2021-07-26 DIAGNOSIS — M0630 Rheumatoid nodule, unspecified site: Secondary | ICD-10-CM

## 2021-08-20 ENCOUNTER — Ambulatory Visit: Payer: BC Managed Care – PPO | Admitting: Podiatry

## 2021-08-22 ENCOUNTER — Ambulatory Visit: Payer: BC Managed Care – PPO | Admitting: Podiatry

## 2021-08-22 DIAGNOSIS — M0630 Rheumatoid nodule, unspecified site: Secondary | ICD-10-CM

## 2021-08-22 DIAGNOSIS — M778 Other enthesopathies, not elsewhere classified: Secondary | ICD-10-CM | POA: Diagnosis not present

## 2021-08-22 NOTE — Progress Notes (Signed)
She presents today for follow-up of her MRI.  She states that the nodules on the bottom of her foot are not tender as long as she wears her shoes.  States that she has recently just started Humira and has not had any flares at all.  She has not noticed any decrease in the size of the lesions yet.  Objective: Vital signs are stable alert oriented x3 no change in physical exam MRI does demonstrate these lesions as bursa.  Does not state that they are pannus formations or rheumatoid nodules.  Assessment bursitis or rheumatoid nodules.  Plan: After discussing with patient great detail today I think the best thing for [place] to do currently is to accommodate these and she would like to go ahead and continue the use of her Humira and see if they reduce in size at all.  She will notify me with questions or concerns

## 2021-08-30 ENCOUNTER — Other Ambulatory Visit: Payer: Self-pay | Admitting: *Deleted

## 2021-08-30 DIAGNOSIS — Z79899 Other long term (current) drug therapy: Secondary | ICD-10-CM

## 2021-08-30 MED ORDER — METHOTREXATE SODIUM 2.5 MG PO TABS: ORAL_TABLET | ORAL | 0 refills | Status: DC

## 2021-08-30 NOTE — Telephone Encounter (Signed)
Refill request received via fax  Next Visit: 09/18/2021  Last Visit: 07/25/2021  Last Fill: 06/03/2021  VE:[id number] arthritis with rheumatoid factor of multiple sites without organ or systems involvement   Current Dose per office note 07/25/2021: Methotrexate 5 tablets by mouth twice weekly  Labs: 07/19/2021 CBC and CMP are normal.   TB Gold: 07/19/2021 Neg    Okay to refill MTX?

## 2021-08-31 LAB — CBC WITH DIFFERENTIAL/PLATELET
Absolute Monocytes: 300 cells/uL (ref 200–950)
Basophils Absolute: 10 cells/uL (ref 0–200)
Basophils Relative: 0.2 %
Eosinophils Absolute: 20 cells/uL (ref 15–500)
Eosinophils Relative: 0.4 %
HCT: 45.8 % — ABNORMAL HIGH (ref 35.0–45.0)
Hemoglobin: 15.5 g/dL (ref 11.7–15.5)
Lymphs Abs: 2450 cells/uL (ref 850–3900)
MCH: 34.8 pg — ABNORMAL HIGH (ref 27.0–33.0)
MCHC: 33.8 g/dL (ref 32.0–36.0)
MCV: 102.7 fL — ABNORMAL HIGH (ref 80.0–100.0)
MPV: 9.6 fL (ref 7.5–12.5)
Monocytes Relative: 6 %
Neutro Abs: 2220 cells/uL (ref 1500–7800)
Neutrophils Relative %: 44.4 %
Platelets: 207 10*3/uL (ref 140–400)
RBC: 4.46 10*6/uL (ref 3.80–5.10)
RDW: 12.7 % (ref 11.0–15.0)
Total Lymphocyte: 49 %
WBC: 5 10*3/uL (ref 3.8–10.8)

## 2021-08-31 LAB — COMPLETE METABOLIC PANEL WITH GFR
AG Ratio: 2.1 (calc) (ref 1.0–2.5)
ALT: 17 U/L (ref 6–29)
AST: 18 U/L (ref 10–30)
Albumin: 4.6 g/dL (ref 3.6–5.1)
Alkaline phosphatase (APISO): 38 U/L (ref 31–125)
BUN: 12 mg/dL (ref 7–25)
CO2: 26 mmol/L (ref 20–32)
Calcium: 9.1 mg/dL (ref 8.6–10.2)
Chloride: 104 mmol/L (ref 98–110)
Creat: 0.97 mg/dL (ref 0.50–0.97)
Globulin: 2.2 g/dL (calc) (ref 1.9–3.7)
Glucose, Bld: 73 mg/dL (ref 65–99)
Potassium: 4.5 mmol/L (ref 3.5–5.3)
Sodium: 139 mmol/L (ref 135–146)
Total Bilirubin: 0.5 mg/dL (ref 0.2–1.2)
Total Protein: 6.8 g/dL (ref 6.1–8.1)
eGFR: 78 mL/min/{1.73_m2} (ref >=60)

## 2021-09-02 NOTE — Progress Notes (Signed)
CBC and CMP are stable.

## 2021-09-04 NOTE — Progress Notes (Signed)
Office Visit Note  Patient: Ruth Wells             Date of Birth: 1985-07-31           MRN: [id number]             PCP: Haywood Pao, MD Referring: Haywood Pao, MD Visit Date: 09/18/2021 Occupation: '@GUAROCC'$ @  Subjective:  Medication monitoring   History of Present Illness: Ruth Wells is a 36 y.o. female with history of seropositive rheumatoid arthritis and osteoarthritis.  Patient is currently on Humira 40 mg subcu as injections every 14 days and methotrexate 5 tablets by mouth twice weekly, Plaquenil 200 mg 1 tablet by mouth twice daily Monday through Friday, and folic acid 2 mg daily.  Humira was started in the office on 07/25/2021.  She has been tolerating Humira without any side effects or injection site reactions.  She states that she has noticed significant improvement in her joint pain and inflammation since initiating Humira. She has had 2 days of mild pain and stiffness in her hands, which were self resolving.  She denies any other joint pain or joint swelling at this time.  Overall she has noticed a significant improvement in her symptoms and is glad that she was started on Humira.  She denies any new medical conditions.  She has not had any recent or recurrent infections.  She has an upcoming dermatology appointment on Friday and is scheduled for a Plaquenil eye examination in August 2023.    Activities of Daily Living:  Patient reports morning stiffness for 0 minutes.   Patient Denies nocturnal pain.  Difficulty dressing/grooming: Denies Difficulty climbing stairs: Denies Difficulty getting out of chair: Denies Difficulty using hands for taps, buttons, cutlery, and/or writing: Denies  Review of Systems  Constitutional:  Positive for fatigue.  HENT:  Negative for mouth sores and mouth dryness.   Eyes:  Negative for dryness.  Respiratory:  Negative for shortness of breath.   Cardiovascular:  Negative for chest pain and palpitations.   Gastrointestinal:  Negative for blood in stool, constipation and diarrhea.  Endocrine: Negative for increased urination.  Genitourinary:  Negative for involuntary urination.  Musculoskeletal:  Positive for joint pain and joint pain. Negative for joint swelling, myalgias, muscle weakness, morning stiffness, muscle tenderness and myalgias.  Skin:  Positive for sensitivity to sunlight. Negative for color change, rash and hair loss.  Allergic/Immunologic: Negative for susceptible to infections.  Neurological:  Negative for dizziness and headaches.  Hematological:  Negative for swollen glands.  Psychiatric/Behavioral:  Negative for depressed mood and sleep disturbance. The patient is not nervous/anxious.     PMFS History:  Patient Active Problem List   Diagnosis Date Noted   Primary osteoarthritis of both hands 02/24/2016   Primary osteoarthritis of both feet 02/24/2016   High risk medication use 02/15/2016   Raynaud's syndrome without gangrene 02/15/2016   Rheumatoid arthritis with rheumatoid factor of multiple sites without organ or systems involvement ([place]) 02/15/2016    Past Medical History:  Diagnosis Date   Arthritis    Hypertension     Family History  Problem Relation Age of Onset   Arthritis Mother    Past Surgical History:  Procedure Laterality Date   WISDOM TOOTH EXTRACTION     Social History   Social History Narrative   Not on file   Immunization History  Administered Date(s) Administered   Moderna Sars-Covid-2 Vaccination 04/06/2020   PFIZER(Purple Top)SARS-COV-2 Vaccination 05/20/2019, 06/11/2019, 10/14/2019  Pneumococcal Polysaccharide-23 02/26/2016     Objective: Vital Signs: BP 116/83 (BP Location: Left Arm, Patient Position: Sitting, Cuff Size: Normal)   Pulse (!) 101   Ht '5\' 5"'$  (1.651 m)   Wt 156 lb 9.6 oz (71 kg)   BMI 26.06 kg/m    Physical Exam Vitals and nursing note reviewed.  Constitutional:      Appearance: She is well-developed.  HENT:      Head: Normocephalic and atraumatic.  Eyes:     Conjunctiva/sclera: Conjunctivae normal.  Cardiovascular:     Rate and Rhythm: Normal rate and regular rhythm.     Heart sounds: Normal heart sounds.  Pulmonary:     Effort: Pulmonary effort is normal.     Breath sounds: Normal breath sounds.  Abdominal:     General: Bowel sounds are normal.     Palpations: Abdomen is soft.  Musculoskeletal:     Cervical back: Normal range of motion.  Skin:    General: Skin is warm and dry.     Capillary Refill: Capillary refill takes less than 2 seconds.  Neurological:     Mental Status: She is alert and oriented to person, place, and time.  Psychiatric:        Behavior: Behavior normal.      Musculoskeletal Exam: C-spine, thoracic spine, lumbar spine have good range of motion.  No midline spinal tenderness or SI joint tenderness.  Shoulder joints, elbow joints, wrist joints, MCPs, PIPs, DIPs have good range of motion with no synovitis.  She was able to make a complete fist bilaterally.  Hip joints have good range of motion with no groin pain.  Knee joints have good range of motion with no warmth or effusion.  Ankle joints have good range of motion with no tenderness or joint swelling.  No tenderness over MTP joints.  CDAI Exam: CDAI Score: 0.2  Patient Global: 1 mm; Provider Global: 1 mm Swollen: 0 ; Tender: 0  Joint Exam 09/18/2021   No joint exam has been documented for this visit   There is currently no information documented on the homunculus. Go to the Rheumatology activity and complete the homunculus joint exam.  Investigation: No additional findings.  Imaging: No results found.  Recent Labs: Lab Results  Component Value Date   WBC 5.0 08/30/2021   HGB 15.5 08/30/2021   PLT 207 08/30/2021   NA 139 08/30/2021   K 4.5 08/30/2021   CL 104 08/30/2021   CO2 26 08/30/2021   GLUCOSE 73 08/30/2021   BUN 12 08/30/2021   CREATININE 0.97 08/30/2021   BILITOT 0.5 08/30/2021    ALKPHOS 42 10/07/2016   AST 18 08/30/2021   ALT 17 08/30/2021   PROT 6.8 08/30/2021   ALBUMIN 4.0 10/07/2016   CALCIUM 9.1 08/30/2021   GFRAA 103 06/14/2020   QFTBGOLDPLUS NEGATIVE 07/19/2021    Speciality Comments: PLQ Eye exam: 10/12/2020 WNL At [facility] Opthalmology Follow up in 1 year  Procedures:  No procedures performed Allergies: Patient has no known allergies.   Assessment / Plan:     Visit Diagnoses: Rheumatoid arthritis with rheumatoid factor of multiple sites without organ or systems involvement ([place]) - Positive RF, negative anti-CCP erosive disease, history of JRA: She has no joint tenderness or synovitis on examination today.  She has not had any signs or symptoms of a rheumatoid arthritis flare recently.  She is clinically doing well on Humira 40 mg subcutaneous injections every 14 days, methotrexate 5 tablets by mouth twice weekly,  and Plaquenil 200 mg 1 tablet by mouth twice daily Monday through Friday.  She was started on Humira on 07/25/2021 and has been tolerating it without any side effects or injection site reactions.  She has noticed significant clinical improvement since adding on Humira as triple therapy.  She has been using the myabbevie app  to log her symptoms and she has only had 2 days of mild discomfort in her hands but no active inflammation during these episodes.  The patient was encouraged to remain on Humira and methotrexate as combination therapy.  She can taper off of Plaquenil.  She was advised to notify us if she develops increased joint pain or joint swelling.  She will follow-up in the office in 2 to 3 months to assess her full response to Humira.  High risk medication use - Humira 40 mg sq injections once every 14 days and Methotrexate 5 tablets by mouth twice weekly, and folic acid 2 mg by mouth daily.   Advised the patient to discontinue plaquenil.   CBC and CMP were stable on 08/30/2021.  Her next lab work will be due in October and every 3 months to  monitor for drug toxicity.  Standing orders for CBC and CMP remain in place.   PLQ Eye exam: 10/12/2020 [facility] At [facility] Opthalmology Follow up in 1 year. She has a plaquenil eye exam scheduled.   TB Gold negative on 07/19/2021 and will continue to be monitored yearly. Discussed the importance of holding Humira and methotrexate if she develops signs or symptoms of an infection and to resume once infection is completely cleared.  She voiced understanding. Discussed the importance of yearly skin examinations while on Humira due to the increased risk for skin cancer.  She is scheduled with dermatology on Friday.   Primary osteoarthritis of both hands: Her joint pain and inflammation have improved significantly since adding Humira on 07/25/2021.  On examination today she did not have any joint tenderness or synovitis.  She was able to make a complete fist bilaterally.  Primary osteoarthritis of both feet:  Evaluated by Dr. Milinda Pointer. She is not experiencing any discomfort in her feet at this time.  No tenderness or synovitis of ankle joints.  No tenderness over MTP joints.   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  She has not experienced any flares in her feet since initiating Humira. Discussed the importance of wearing proper fitting shoes.  Raynaud's syndrome without gangrene: Not currently active.  No digital ulcerations or signs of gangrene.   Trochanteric bursitis, left RUE:[id number].   Orders: No orders of the defined types were placed in this encounter.  No orders of the defined types were placed in this encounter.     Follow-Up Instructions: Return in about 3 months (around 12/19/2021) for Rheumatoid arthritis, Osteoarthritis.   Ofilia Neas, PA-C  Note - This record has been created using Dragon software.  Chart creation errors have been sought, but may not always  have been located. Such creation errors do not reflect on   the standard of medical care.

## 2021-09-18 ENCOUNTER — Encounter: Payer: Self-pay | Admitting: Physician Assistant

## 2021-09-18 ENCOUNTER — Ambulatory Visit: Payer: BC Managed Care – PPO | Admitting: Physician Assistant

## 2021-09-18 VITALS — BP 116/83 | HR 101 | Ht 65.0 in | Wt 156.6 lb

## 2021-09-18 DIAGNOSIS — M19042 Primary osteoarthritis, left hand: Secondary | ICD-10-CM

## 2021-09-18 DIAGNOSIS — Z79899 Other long term (current) drug therapy: Secondary | ICD-10-CM | POA: Diagnosis not present

## 2021-09-18 DIAGNOSIS — M19071 Primary osteoarthritis, right ankle and foot: Secondary | ICD-10-CM

## 2021-09-18 DIAGNOSIS — M7062 Trochanteric bursitis, left hip: Secondary | ICD-10-CM

## 2021-09-18 DIAGNOSIS — I7300 Raynaud's syndrome without gangrene: Secondary | ICD-10-CM

## 2021-09-18 DIAGNOSIS — M19072 Primary osteoarthritis, left ankle and foot: Secondary | ICD-10-CM

## 2021-09-18 DIAGNOSIS — M0579 Rheumatoid arthritis with rheumatoid factor of multiple sites without organ or systems involvement: Secondary | ICD-10-CM

## 2021-09-18 DIAGNOSIS — M19041 Primary osteoarthritis, right hand: Secondary | ICD-10-CM | POA: Diagnosis not present

## 2021-09-18 NOTE — Patient Instructions (Addendum)
Standing Labs We placed an order today for your standing lab work.   Please have your standing labs drawn in October and every 3 months   If possible, please have your labs drawn 2 weeks prior to your appointment so that the provider can discuss your results at your appointment.  Please note that you may see your imaging and lab results in MyChart before we have reviewed them. We may be awaiting multiple results to interpret others before contacting you. Please allow our office up to 72 hours to thoroughly review all of the results before contacting the office for clarification of your results.  We have open lab daily: Monday through Thursday from 1:30-4:30 PM and Friday from 1:30-4:00 PM at the office of Dr. Shaili Deveshwar,  Rheumatology.   Please be advised, all patients with office appointments requiring lab work will take precedent over walk-in lab work.  If possible, please come for your lab work on Monday and Friday afternoons, as you may experience shorter wait times. The office is located at [address], [address], [place],  [address] No appointment is necessary.   Labs are drawn by [facility]. Please bring your co-pay at the time of your lab draw.  You may receive a bill from [facility] for your lab work.  Please note if you are on Hydroxychloroquine and and an order has been placed for a Hydroxychloroquine level, you will need to have it drawn 4 hours or more after your last dose.  If you wish to have your labs drawn at another location, please call the office 24 hours in advance to send orders.  If you have any questions regarding directions or hours of operation,  please call [phone].   As a reminder, please drink plenty of water prior to coming for your lab work. Thanks!  If you have signs or symptoms of an infection or start antibiotics: First, call your PCP for workup of your infection. Hold your medication through the infection, until you complete your  antibiotics, and until symptoms resolve if you take the following: Injectable medication (Actemra, Benlysta, Cimzia, Cosentyx, Enbrel, Humira, Kevzara, Orencia, Remicade, Simponi, Stelara, Taltz, Tremfya) Methotrexate Leflunomide (Arava) Mycophenolate (Cellcept) Xeljanz, Olumiant, or Rinvoq   

## 2021-10-03 ENCOUNTER — Other Ambulatory Visit: Payer: Self-pay | Admitting: Physician Assistant

## 2021-10-03 DIAGNOSIS — M0579 Rheumatoid arthritis with rheumatoid factor of multiple sites without organ or systems involvement: Secondary | ICD-10-CM

## 2021-10-03 DIAGNOSIS — Z79899 Other long term (current) drug therapy: Secondary | ICD-10-CM

## 2021-10-03 NOTE — Telephone Encounter (Signed)
Next Visit: 12/19/2021  Last Visit: 09/18/2021  Last Fill: 07/25/2021  JH:[id number] arthritis with rheumatoid factor of multiple sites without organ or systems involvement   Current Dose per office note 09/18/2021: Humira 40 mg sq injections once every 14 days   Labs: 08/30/2021 CBC and CMP are stable.  TB Gold: 07/19/2021 Neg    Okay to refill Humira?

## 2021-11-17 ENCOUNTER — Other Ambulatory Visit: Payer: Self-pay | Admitting: Physician Assistant

## 2021-11-18 NOTE — Telephone Encounter (Signed)
Next Visit: 12/19/2021  Last Visit: 09/18/2021  Last Fill: 08/30/2021  DX: Rheumatoid arthritis with rheumatoid factor of multiple sites without organ or systems involvement   Current Dose per office note 09/18/2021: Methotrexate 5 tablets by mouth twice weekly  Labs: 08/30/2021 CBC and CMP are stable.  Okay to refill MTX?

## 2021-12-05 NOTE — Progress Notes (Unsigned)
Office Visit Note  Patient: Ruth Wells             Date of Birth: 10/21/1985           MRN: [id number]             PCP: Haywood Pao, MD Referring: Haywood Pao, MD Visit Date: 12/19/2021 Occupation: '@GUAROCC'$ @  Subjective:  Medication monitoring  History of Present Illness: Ruth Wells is a 36 y.o. female with history of seropositive rheumatoid arthritis and osteoarthritis.  Patient is currently taking Humira 40 mg sq injections once every 14 days and Methotrexate 5 tablets by mouth twice weekly, and folic acid 2 mg by mouth daily.  She is tolerating combination therapy without any side effects or injection site reactions from Humira.  She has not missed any doses recently.  She denies any recent or recurrent infections.  She received the annual flu shot and COVID booster earlier this month.  She denies any signs or symptoms of a rheumatoid arthritis flare.  She denies any joint swelling.       Activities of Daily Living:  Patient reports morning stiffness for 0 minute.   Patient Denies nocturnal pain.  Difficulty dressing/grooming: Denies Difficulty climbing stairs: Denies Difficulty getting out of chair: Denies Difficulty using hands for taps, buttons, cutlery, and/or writing: Denies  Review of Systems  Constitutional:  Negative for fatigue.  HENT:  Negative for mouth sores and mouth dryness.   Eyes:  Negative for dryness.  Respiratory:  Negative for shortness of breath.   Cardiovascular:  Negative for chest pain and palpitations.  Gastrointestinal:  Negative for blood in stool, constipation and diarrhea.  Endocrine: Negative for increased urination.  Genitourinary:  Negative for involuntary urination.  Musculoskeletal:  Negative for joint pain, gait problem, joint pain, joint swelling, myalgias, muscle weakness, morning stiffness, muscle tenderness and myalgias.  Skin:  Positive for sensitivity to sunlight. Negative for color change, rash and hair  loss.  Allergic/Immunologic: Negative for susceptible to infections.  Neurological:  Negative for dizziness and headaches.  Hematological:  Negative for swollen glands.  Psychiatric/Behavioral:  Negative for depressed mood and sleep disturbance. The patient is not nervous/anxious.     PMFS History:  Patient Active Problem List   Diagnosis Date Noted   Primary osteoarthritis of both hands 02/24/2016   Primary osteoarthritis of both feet 02/24/2016   High risk medication use 02/15/2016   Raynaud's syndrome without gangrene 02/15/2016   Rheumatoid arthritis with rheumatoid factor of multiple sites without organ or systems involvement ([place]) 02/15/2016    Past Medical History:  Diagnosis Date   Arthritis    Hypertension     Family History  Problem Relation Age of Onset   Arthritis Mother    Past Surgical History:  Procedure Laterality Date   WISDOM TOOTH EXTRACTION     Social History   Social History Narrative   Not on file   Immunization History  Administered Date(s) Administered   Moderna Sars-Covid-2 Vaccination 04/06/2020   PFIZER(Purple Top)SARS-COV-2 Vaccination 05/20/2019, 06/11/2019, 10/14/2019   Pneumococcal Polysaccharide-23 02/26/2016     Objective: Vital Signs: BP 105/74 (BP Location: Left Arm, Patient Position: Sitting, Cuff Size: Normal)   Pulse 78   Resp 15   Ht '5\' 5"'$  (1.651 m)   Wt 161 lb 9.6 oz (73.3 kg)   BMI 26.89 kg/m    Physical Exam Vitals and nursing note reviewed.  Constitutional:      Appearance: She is well-developed.  HENT:  Head: Normocephalic and atraumatic.  Eyes:     Conjunctiva/sclera: Conjunctivae normal.  Cardiovascular:     Rate and Rhythm: Normal rate and regular rhythm.     Heart sounds: Normal heart sounds.  Pulmonary:     Effort: Pulmonary effort is normal.     Breath sounds: Normal breath sounds.  Abdominal:     General: Bowel sounds are normal.     Palpations: Abdomen is soft.  Musculoskeletal:     Cervical  back: Normal range of motion.  Skin:    General: Skin is warm and dry.     Capillary Refill: Capillary refill takes less than 2 seconds.  Neurological:     Mental Status: She is alert and oriented to person, place, and time.  Psychiatric:        Behavior: Behavior normal.      Musculoskeletal Exam: C-spine, thoracic spine, and lumbar spine good ROM.  No midline spinal tenderness or SI joint tenderness upon palpation.  Shoulder joints, elbow joints, wrist joints, MCPs, PIPs, and DIPs good ROM with no synovitis.  Complete fist formation bilaterally.  Hip joints have good range of motion with no groin pain.  Knee joints have good range of motion with no warmth or effusion.  Ankle joints have good range of motion with no tenderness or joint swelling.  CDAI Exam: CDAI Score: -- Patient Global: 0 mm; Provider Global: 0 mm Swollen: --; Tender: -- Joint Exam 12/19/2021   No joint exam has been documented for this visit   There is currently no information documented on the homunculus. Go to the Rheumatology activity and complete the homunculus joint exam.  Investigation: No additional findings.  Imaging: No results found.  Recent Labs: Lab Results  Component Value Date   WBC 7.1 12/13/2021   HGB 14.8 12/13/2021   PLT 210 12/13/2021   NA 139 12/13/2021   K 4.1 12/13/2021   CL 105 12/13/2021   CO2 25 12/13/2021   GLUCOSE 73 12/13/2021   BUN 11 12/13/2021   CREATININE 0.95 12/13/2021   BILITOT 0.5 12/13/2021   ALKPHOS 42 10/07/2016   AST 20 12/13/2021   ALT 31 (H) 12/13/2021   PROT 6.4 12/13/2021   ALBUMIN 4.0 10/07/2016   CALCIUM 8.7 12/13/2021   GFRAA 103 06/14/2020   QFTBGOLDPLUS NEGATIVE 07/19/2021    Speciality Comments: PLQ Eye exam: 10/16/2021 WNL At [facility] Opthalmology Follow up in 1 year  Procedures:  No procedures performed Allergies: Patient has no known allergies.   Assessment / Plan:     Visit Diagnoses: Rheumatoid arthritis with rheumatoid factor of  multiple sites without organ or systems involvement ([place]) - Positive RF, negative anti-CCP erosive disease, history of JRA: She has no joint tenderness or synovitis on examination today.  She has not had any signs or symptoms of a rheumatoid arthritis flare.  She has clinically been doing well on Humira 40 mg sq injections every 14 days and methotrexate 5 tablets twice weekly.  She is tolerating combination therapy without any side effects or injection site reactions from Humira.  She has not had any morning stiffness, nocturnal pain, or difficulty with ADLs. ALT was borderline elevated at 31 on 12/13/2021.  Reassured the patient that the ALT is only borderline elevated and we will continue to monitor her lab work closely.  Since she has clinically been doing well we discussed reducing the dose of methotrexate from 10 tablets weekly to 8 tablets weekly.  She was in agreement.  She will remain  on Humira as prescribed.  She will have updated lab work in January and every 3 months to monitor for drug toxicity.  She was advised to notify us if she develops any signs or symptoms of a flare on the reduced dose of methotrexate.  She will follow-up in the office in 3 months or sooner if needed.  High risk medication use - Humira 40 mg sq injections once every 14 days and Methotrexate 4 tablets by mouth twice weekly, and folic acid 2 mg by mouth daily Advised the patient to reduce methotrexate to 4 tablets twice weekly due to clinically doing well and borderline elevation in ALT with recent lab work.  CBC and CMP were drawn on 12/13/2021.  ALT was borderline elevated at 31.  She has not been taking any NSAIDs and she does not drink alcohol.  She takes Tylenol very sparingly if she has a headache.  Her next lab work will be due in January and every 3 months to monitor for drug toxicity. TB Gold negative on 07/19/2021. Discussed the importance of holding Humira and methotrexate if she develops signs or symptoms of an  infection and to resume once the infection has completely cleared. She received the covid-19 booster and annual flu shot in October 2023.   Primary osteoarthritis of both hands: She has no tenderness or synovitis on examination today.   Primary osteoarthritis of both feet - Dr. Milinda Pointer.  She is wearing proper fitting shoes.    Raynaud's syndrome without gangrene: Not currently active.  No signs of digital ulcerations or signs of gangrene.   Trochanteric bursitis, left hip: Resolved.   Orders: No orders of the defined types were placed in this encounter.  No orders of the defined types were placed in this encounter.    Follow-Up Instructions: Return in about 3 months (around 03/21/2022) for Rheumatoid arthritis.   Ofilia Neas, PA-C  Note - This record has been created using Dragon software.  Chart creation errors have been sought, but may not always  have been located. Such creation errors do not reflect on  the standard of medical care.

## 2021-12-13 ENCOUNTER — Other Ambulatory Visit: Payer: Self-pay | Admitting: *Deleted

## 2021-12-13 DIAGNOSIS — Z79899 Other long term (current) drug therapy: Secondary | ICD-10-CM

## 2021-12-13 LAB — COMPLETE METABOLIC PANEL WITH GFR
AG Ratio: 1.9 (calc) (ref 1.0–2.5)
ALT: 31 U/L — ABNORMAL HIGH (ref 6–29)
AST: 20 U/L (ref 10–30)
Albumin: 4.2 g/dL (ref 3.6–5.1)
Alkaline phosphatase (APISO): 40 U/L (ref 31–125)
BUN: 11 mg/dL (ref 7–25)
CO2: 25 mmol/L (ref 20–32)
Calcium: 8.7 mg/dL (ref 8.6–10.2)
Chloride: 105 mmol/L (ref 98–110)
Creat: 0.95 mg/dL (ref 0.50–0.97)
Globulin: 2.2 g/dL (calc) (ref 1.9–3.7)
Glucose, Bld: 73 mg/dL (ref 65–99)
Potassium: 4.1 mmol/L (ref 3.5–5.3)
Sodium: 139 mmol/L (ref 135–146)
Total Bilirubin: 0.5 mg/dL (ref 0.2–1.2)
Total Protein: 6.4 g/dL (ref 6.1–8.1)
eGFR: 80 mL/min/{1.73_m2} (ref >=60)

## 2021-12-13 LAB — CBC WITH DIFFERENTIAL/PLATELET
Absolute Monocytes: 391 cells/uL (ref 200–950)
Basophils Absolute: 7 cells/uL (ref 0–200)
Basophils Relative: 0.1 %
Eosinophils Absolute: 50 cells/uL (ref 15–500)
Eosinophils Relative: 0.7 %
HCT: 42.5 % (ref 35.0–45.0)
Hemoglobin: 14.8 g/dL (ref 11.7–15.5)
Lymphs Abs: 3735 cells/uL (ref 850–3900)
MCH: 35 pg — ABNORMAL HIGH (ref 27.0–33.0)
MCHC: 34.8 g/dL (ref 32.0–36.0)
MCV: 100.5 fL — ABNORMAL HIGH (ref 80.0–100.0)
MPV: 9.8 fL (ref 7.5–12.5)
Monocytes Relative: 5.5 %
Neutro Abs: 2918 cells/uL (ref 1500–7800)
Neutrophils Relative %: 41.1 %
Platelets: 210 10*3/uL (ref 140–400)
RBC: 4.23 10*6/uL (ref 3.80–5.10)
RDW: 12.3 % (ref 11.0–15.0)
Total Lymphocyte: 52.6 %
WBC: 7.1 10*3/uL (ref 3.8–10.8)

## 2021-12-15 NOTE — Progress Notes (Signed)
CBC is a stable, ALT is mildly elevated.  Patient should avoid use of Tylenol, NSAIDs and Voltaren gel.  We will continue to monitor labs.

## 2021-12-19 ENCOUNTER — Encounter: Payer: Self-pay | Admitting: Physician Assistant

## 2021-12-19 ENCOUNTER — Ambulatory Visit: Payer: BC Managed Care – PPO | Attending: Physician Assistant | Admitting: Physician Assistant

## 2021-12-19 ENCOUNTER — Other Ambulatory Visit: Payer: Self-pay

## 2021-12-19 VITALS — BP 105/74 | HR 78 | Resp 15 | Ht 65.0 in | Wt 161.6 lb

## 2021-12-19 DIAGNOSIS — M0579 Rheumatoid arthritis with rheumatoid factor of multiple sites without organ or systems involvement: Secondary | ICD-10-CM

## 2021-12-19 DIAGNOSIS — M19072 Primary osteoarthritis, left ankle and foot: Secondary | ICD-10-CM

## 2021-12-19 DIAGNOSIS — Z79899 Other long term (current) drug therapy: Secondary | ICD-10-CM

## 2021-12-19 DIAGNOSIS — M19071 Primary osteoarthritis, right ankle and foot: Secondary | ICD-10-CM | POA: Diagnosis not present

## 2021-12-19 DIAGNOSIS — M7062 Trochanteric bursitis, left hip: Secondary | ICD-10-CM

## 2021-12-19 DIAGNOSIS — M19042 Primary osteoarthritis, left hand: Secondary | ICD-10-CM

## 2021-12-19 DIAGNOSIS — M19041 Primary osteoarthritis, right hand: Secondary | ICD-10-CM | POA: Diagnosis not present

## 2021-12-19 DIAGNOSIS — I7300 Raynaud's syndrome without gangrene: Secondary | ICD-10-CM

## 2021-12-19 MED ORDER — METHOTREXATE SODIUM 2.5 MG PO TABS: 20.0000 mg | ORAL_TABLET | ORAL | 0 refills | Status: DC

## 2021-12-19 NOTE — Patient Instructions (Signed)
Standing Labs We placed an order today for your standing lab work.   Please have your standing labs drawn in January and every 3 months   Please have your labs drawn 2 weeks prior to your appointment so that the provider can discuss your lab results at your appointment.  Please note that you may see your imaging and lab results in [place] before we have reviewed them. We will contact you once all results are reviewed. Please allow our office up to 72 hours to thoroughly review all of the results before contacting the office for clarification of your results.  Lab hours are:   Monday through Thursday from 8:00 am -12:30 pm and 1:00 pm-5:00 pm and Friday from 8:00 am-12:00 pm.  Please be advised, all patients with office appointments requiring lab work will take precedent over walk-in lab work.   Labs are drawn by [facility]. Please bring your co-pay at the time of your lab draw.  You may receive a bill from [facility] for your lab work.  Please note if you are on Hydroxychloroquine and and an order has been placed for a Hydroxychloroquine level, you will need to have it drawn 4 hours or more after your last dose.  If you wish to have your labs drawn at another location, please call the office 24 hours in advance so we can fax the orders.  The office is located at [address], [place], [place], [place] [address] No appointment is necessary.    If you have any questions regarding directions or hours of operation,  please call [phone].   As a reminder, please drink plenty of water prior to coming for your lab work. Thanks!  If you have signs or symptoms of an infection or start antibiotics: First, call your PCP for workup of your infection. Hold your medication through the infection, until you complete your antibiotics, and until symptoms resolve if you take the following: Injectable medication (Actemra, Benlysta, Cimzia, Cosentyx, Enbrel, Humira, Kevzara, Orencia, Remicade, Simponi,  Stelara, Taltz, Tremfya) Methotrexate Leflunomide (Arava) Mycophenolate (Cellcept) Morrie Sheldon, Olumiant, or Rinvoq   Vaccines You are taking a medication(s) that can suppress your immune system.  The following immunizations are recommended: Flu annually Covid-19  Td/Tdap (tetanus, diphtheria, pertussis) every 10 years Pneumonia (Prevnar 15 then Pneumovax 23 at least 1 year apart.  Alternatively, can take Prevnar 20 without needing additional dose) Shingrix: 2 doses from 4 weeks to 6 months apart  Please check with your PCP to make sure you are up to date.

## 2021-12-19 NOTE — Progress Notes (Unsigned)
Please review and sign pended methotrexate prescription (no print). Thanks!

## 2021-12-25 ENCOUNTER — Other Ambulatory Visit: Payer: Self-pay | Admitting: Rheumatology

## 2021-12-25 DIAGNOSIS — M0579 Rheumatoid arthritis with rheumatoid factor of multiple sites without organ or systems involvement: Secondary | ICD-10-CM

## 2021-12-25 DIAGNOSIS — Z79899 Other long term (current) drug therapy: Secondary | ICD-10-CM

## 2021-12-25 NOTE — Telephone Encounter (Signed)
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  TB Gold: 07/19/2021 Neg   Okay to refill Humira?

## 2021-12-26 ENCOUNTER — Encounter: Payer: Self-pay | Admitting: Rheumatology

## 2022-01-23 ENCOUNTER — Other Ambulatory Visit: Payer: Self-pay | Admitting: *Deleted

## 2022-01-23 MED ORDER — FOLIC ACID 1 MG PO TABS
2.0000 mg | ORAL_TABLET | Freq: Every day | ORAL | 4 refills | Status: DC
Start: 2022-01-23 — End: 2023-04-16

## 2022-01-23 NOTE — Telephone Encounter (Signed)
Refill request received via fax from [facility] for Folic Acid  Next Visit: 03/28/2022   Last Visit: 12/19/2021   Last Fill: 11/12/2020  Dx: Rheumatoid arthritis with rheumatoid factor of multiple sites without organ or systems involvement   Current Dose per office note on 35/08/5730: folic acid 2 mg by mouth daily   Okay to refill Folic Acid?

## 2022-03-02 ENCOUNTER — Other Ambulatory Visit: Payer: Self-pay | Admitting: Rheumatology

## 2022-03-03 NOTE — Telephone Encounter (Signed)
Next Visit: 03/28/2022  Last Visit: 12/19/2021  Last Fill: 12/19/2021  DX: Rheumatoid arthritis with rheumatoid factor of multiple sites without organ or systems involvement   Current Dose per office note 12/19/2021: Methotrexate 4 tablets by mouth twice weekly   Labs: 12/13/2021 CBC is a stable, ALT is mildly elevated.   Okay to refill MTX?

## 2022-03-14 NOTE — Progress Notes (Signed)
Office Visit Note  Patient: Ruth Wells             Date of Birth: Sep 29, 1985           MRN: [id number]             PCP: Haywood Pao, MD Referring: Haywood Pao, MD Visit Date: 03/28/2022 Occupation: '@GUAROCC'$ @  Subjective:  Medication monitoring   History of Present Illness: Ruth Wells is a 37 y.o. female with history of seropositive rheumatoid arthritis and osteoarthritis.  She remains on Humira 40 mg sq injections once every 14 days and Methotrexate 4 tablets by mouth twice weekly, and folic acid 2 mg by mouth daily.  She continues to tolerate combination therapy without any side effects.  She has not had to miss any doses recently.  She denies any recent or recurrent infections.  She denies any signs or symptoms of a rheumatoid arthritis flare.  She is not currently experiencing any joint pain, morning stiffness, nocturnal symptoms, or difficulty with ADLs.  She denies any joint swelling.  She denies any new medical conditions.  She has been avoiding over-the-counter products for pain relief.   Activities of Daily Living:  Patient reports morning stiffness for 0 minutes.   Patient Denies nocturnal pain.  Difficulty dressing/grooming: Denies Difficulty climbing stairs: Denies Difficulty getting out of chair: Denies Difficulty using hands for taps, buttons, cutlery, and/or writing: Denies  Review of Systems  Constitutional:  Negative for fatigue.  HENT:  Negative for mouth sores and mouth dryness.   Eyes:  Negative for dryness.  Respiratory:  Negative for shortness of breath.   Cardiovascular:  Negative for chest pain and palpitations.  Gastrointestinal:  Negative for blood in stool, constipation and diarrhea.  Endocrine: Negative for increased urination.  Genitourinary:  Negative for involuntary urination.  Musculoskeletal:  Negative for joint pain, gait problem, joint pain, joint swelling, myalgias, muscle weakness, morning stiffness, muscle  tenderness and myalgias.  Skin:  Negative for color change, rash, hair loss and sensitivity to sunlight.  Allergic/Immunologic: Negative for susceptible to infections.  Neurological:  Negative for dizziness and headaches.  Hematological:  Negative for swollen glands.  Psychiatric/Behavioral:  Negative for depressed mood and sleep disturbance. The patient is not nervous/anxious.     PMFS History:  Patient Active Problem List   Diagnosis Date Noted   Primary osteoarthritis of both hands 02/24/2016   Primary osteoarthritis of both feet 02/24/2016   High risk medication use 02/15/2016   Raynaud's syndrome without gangrene 02/15/2016   Rheumatoid arthritis with rheumatoid factor of multiple sites without organ or systems involvement ([place]) 02/15/2016    Past Medical History:  Diagnosis Date   Arthritis    Hypertension     Family History  Problem Relation Age of Onset   Arthritis Mother    Breast cancer Mother    Stroke Father    Past Surgical History:  Procedure Laterality Date   WISDOM TOOTH EXTRACTION     Social History   Social History Narrative   Not on file   Immunization History  Administered Date(s) Administered   Moderna Sars-Covid-2 Vaccination 04/06/2020   PFIZER(Purple Top)SARS-COV-2 Vaccination 05/20/2019, 06/11/2019, 10/14/2019   Pneumococcal Polysaccharide-23 02/26/2016     Objective: Vital Signs: BP 119/78 (BP Location: Left Arm, Patient Position: Sitting, Cuff Size: Normal)   Pulse 85   Resp 16   Ht '5\' 5"'$  (1.651 m)   Wt 164 lb 9.6 oz (74.7 kg)   BMI 27.39 kg/m  Physical Exam Vitals and nursing note reviewed.  Constitutional:      Appearance: She is well-developed.  HENT:     Head: Normocephalic and atraumatic.  Eyes:     Conjunctiva/sclera: Conjunctivae normal.  Cardiovascular:     Rate and Rhythm: Normal rate and regular rhythm.     Heart sounds: Normal heart sounds.  Pulmonary:     Effort: Pulmonary effort is normal.     Breath sounds:  Normal breath sounds.  Abdominal:     General: Bowel sounds are normal.     Palpations: Abdomen is soft.  Musculoskeletal:     Cervical back: Normal range of motion.  Skin:    General: Skin is warm and dry.     Capillary Refill: Capillary refill takes less than 2 seconds.  Neurological:     Mental Status: She is alert and oriented to person, place, and time.  Psychiatric:        Behavior: Behavior normal.      Musculoskeletal Exam: C-spine, thoracic spine, lumbar spine have good range of motion with no discomfort.  No midline spinal tenderness.  No SI joint tenderness.  Shoulder joints, elbow joints, wrist joints, MCPs, PIPs, DIPs have good range of motion with no synovitis.  Complete fist formation bilaterally.  Hip joints have good range of motion with no groin pain.  No tenderness over trochanteric bursa bilaterally.  Knee joints have good range of motion with no warmth or effusion.  Ankle joints have good range of motion with no tenderness or joint swelling.  No tenderness or synovitis over MTP joints.  Tailor bunions noted bilaterally.  CDAI Exam: CDAI Score: -- Patient Global: 1 mm; Provider Global: 1 mm Swollen: --; Tender: -- Joint Exam 03/28/2022   No joint exam has been documented for this visit   There is currently no information documented on the homunculus. Go to the Rheumatology activity and complete the homunculus joint exam.  Investigation: No additional findings.  Imaging: No results found.  Recent Labs: Lab Results  Component Value Date   WBC 6.8 03/24/2022   HGB 15.3 03/24/2022   PLT 217 03/24/2022   NA 140 03/24/2022   K 4.2 03/24/2022   CL 106 03/24/2022   CO2 24 03/24/2022   GLUCOSE 102 (H) 03/24/2022   BUN 10 03/24/2022   CREATININE 0.97 03/24/2022   BILITOT 0.5 03/24/2022   ALKPHOS 42 10/07/2016   AST 18 03/24/2022   ALT 16 03/24/2022   PROT 6.6 03/24/2022   ALBUMIN 4.0 10/07/2016   CALCIUM 9.1 03/24/2022   GFRAA 103 06/14/2020    QFTBGOLDPLUS NEGATIVE 07/19/2021    Speciality Comments: PLQ Eye exam: 10/16/2021 [facility] Follow up in 1 year  Procedures:  No procedures performed Allergies: Patient has no known allergies.   Assessment / Plan:     Visit Diagnoses: Rheumatoid arthritis with rheumatoid factor of multiple sites without organ or systems involvement ([place]) - Positive RF, negative anti-CCP erosive disease, history of JRA: She has no tenderness or synovitis on examination today.  She has not had any signs or symptoms of a rheumatoid arthritis flare.  She has clinically been doing well on Humira 40 mg subcutaneous injections every 14 days and methotrexate 4 tablets by mouth twice weekly.  She is tolerating combination therapy without any side effects and has not missed any doses recently.  She has not had any recent or recurrent infections.  She is not experiencing any morning stiffness, nocturnal pain, difficulty with ADLs,  or joint swelling.  She will remain on Humira and methotrexate as combination therapy.  She was advised to notify us if she develops increased joint pain or joint swelling.  She will follow-up in the office in 5 months or sooner if needed.  High risk medication use - Humira 40 mg sq injections once every 14 days, Methotrexate 4 tablets by mouth twice weekly, and folic acid 2 mg by mouth daily TB gold negative on 07/19/21. CBC and CMP updated on 03/24/22. Her next lab work will be due in April and every 3 months. No recent or recurrent infections.  Discussed the importance of holding humira and methotrexate if she develops signs or symptoms of an infection and to resume once the infection has completely cleared.  Patient was recently evaluated by her dermatologist.  Primary osteoarthritis of both hands: No tenderness or inflammation noted on examination today.  Complete fist formation bilaterally.  Discussed the importance of joint protection and muscle strengthening.  Primary  osteoarthritis of both feet - Dr. Milinda Pointer.  She is not experiencing any discomfort in her feet at this time.  She has good range of motion of both ankle joints with no tenderness or synovitis.  Raynaud's syndrome without gangrene: Not currently active.  No digital ulcerations.  Trochanteric bursitis, left hip: Not currently symptomatic.  No tenderness upon palpation.  Orders: No orders of the defined types were placed in this encounter.  No orders of the defined types were placed in this encounter.     Follow-Up Instructions: Return in about 5 months (around 08/26/2022) for Rheumatoid arthritis, Osteoarthritis.   Ofilia Neas, PA-C  Note - This record has been created using Dragon software.  Chart creation errors have been sought, but may not always  have been located. Such creation errors do not reflect on  the standard of medical care.

## 2022-03-18 ENCOUNTER — Other Ambulatory Visit: Payer: Self-pay | Admitting: Rheumatology

## 2022-03-18 DIAGNOSIS — Z79899 Other long term (current) drug therapy: Secondary | ICD-10-CM

## 2022-03-18 DIAGNOSIS — M0579 Rheumatoid arthritis with rheumatoid factor of multiple sites without organ or systems involvement: Secondary | ICD-10-CM

## 2022-03-18 NOTE — Telephone Encounter (Signed)
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     TB Gold: 07/19/2021 Neg   Patient to update labs at upcoming appointment on 03/28/2022  Okay to refill Humira?

## 2022-03-24 ENCOUNTER — Other Ambulatory Visit: Payer: Self-pay | Admitting: *Deleted

## 2022-03-24 DIAGNOSIS — Z79899 Other long term (current) drug therapy: Secondary | ICD-10-CM

## 2022-03-25 LAB — CBC WITH DIFFERENTIAL/PLATELET
Absolute Monocytes: 340 cells/uL (ref 200–950)
Basophils Absolute: 7 cells/uL (ref 0–200)
Basophils Relative: 0.1 %
Eosinophils Absolute: 27 cells/uL (ref 15–500)
Eosinophils Relative: 0.4 %
HCT: 43.7 % (ref 35.0–45.0)
Hemoglobin: 15.3 g/dL (ref 11.7–15.5)
Lymphs Abs: 2836 cells/uL (ref 850–3900)
MCH: 34.7 pg — ABNORMAL HIGH (ref 27.0–33.0)
MCHC: 35 g/dL (ref 32.0–36.0)
MCV: 99.1 fL (ref 80.0–100.0)
MPV: 10.1 fL (ref 7.5–12.5)
Monocytes Relative: 5 %
Neutro Abs: 3590 cells/uL (ref 1500–7800)
Neutrophils Relative %: 52.8 %
Platelets: 217 10*3/uL (ref 140–400)
RBC: 4.41 10*6/uL (ref 3.80–5.10)
RDW: 11.9 % (ref 11.0–15.0)
Total Lymphocyte: 41.7 %
WBC: 6.8 10*3/uL (ref 3.8–10.8)

## 2022-03-25 LAB — COMPLETE METABOLIC PANEL WITH GFR
AG Ratio: 1.8 (calc) (ref 1.0–2.5)
ALT: 16 U/L (ref 6–29)
AST: 18 U/L (ref 10–30)
Albumin: 4.2 g/dL (ref 3.6–5.1)
Alkaline phosphatase (APISO): 37 U/L (ref 31–125)
BUN: 10 mg/dL (ref 7–25)
CO2: 24 mmol/L (ref 20–32)
Calcium: 9.1 mg/dL (ref 8.6–10.2)
Chloride: 106 mmol/L (ref 98–110)
Creat: 0.97 mg/dL (ref 0.50–0.97)
Globulin: 2.4 g/dL (calc) (ref 1.9–3.7)
Glucose, Bld: 102 mg/dL — ABNORMAL HIGH (ref 65–99)
Potassium: 4.2 mmol/L (ref 3.5–5.3)
Sodium: 140 mmol/L (ref 135–146)
Total Bilirubin: 0.5 mg/dL (ref 0.2–1.2)
Total Protein: 6.6 g/dL (ref 6.1–8.1)
eGFR: 78 mL/min/{1.73_m2} (ref >=60)

## 2022-03-25 NOTE — Progress Notes (Signed)
CBC and CMP are normal.

## 2022-03-28 ENCOUNTER — Ambulatory Visit: Payer: BC Managed Care – PPO | Attending: Physician Assistant | Admitting: Physician Assistant

## 2022-03-28 ENCOUNTER — Encounter: Payer: Self-pay | Admitting: Physician Assistant

## 2022-03-28 VITALS — BP 119/78 | HR 85 | Resp 16 | Ht 65.0 in | Wt 164.6 lb

## 2022-03-28 DIAGNOSIS — M19071 Primary osteoarthritis, right ankle and foot: Secondary | ICD-10-CM | POA: Diagnosis not present

## 2022-03-28 DIAGNOSIS — Z79899 Other long term (current) drug therapy: Secondary | ICD-10-CM

## 2022-03-28 DIAGNOSIS — M19041 Primary osteoarthritis, right hand: Secondary | ICD-10-CM

## 2022-03-28 DIAGNOSIS — M19042 Primary osteoarthritis, left hand: Secondary | ICD-10-CM

## 2022-03-28 DIAGNOSIS — M7062 Trochanteric bursitis, left hip: Secondary | ICD-10-CM

## 2022-03-28 DIAGNOSIS — M0579 Rheumatoid arthritis with rheumatoid factor of multiple sites without organ or systems involvement: Secondary | ICD-10-CM

## 2022-03-28 DIAGNOSIS — I7300 Raynaud's syndrome without gangrene: Secondary | ICD-10-CM

## 2022-03-28 DIAGNOSIS — M19072 Primary osteoarthritis, left ankle and foot: Secondary | ICD-10-CM

## 2022-03-28 NOTE — Patient Instructions (Signed)
Standing Labs We placed an order today for your standing lab work.   Please have your standing labs drawn in April and every 3 months   Please have your labs drawn 2 weeks prior to your appointment so that the provider can discuss your lab results at your appointment.  Please note that you may see your imaging and lab results in [place] before we have reviewed them. We will contact you once all results are reviewed. Please allow our office up to 72 hours to thoroughly review all of the results before contacting the office for clarification of your results.  Lab hours are:   Monday through Thursday from 8:00 am -12:30 pm and 1:00 pm-5:00 pm and Friday from 8:00 am-12:00 pm.  Please be advised, all patients with office appointments requiring lab work will take precedent over walk-in lab work.   Labs are drawn by [facility]. Please bring your co-pay at the time of your lab draw.  You may receive a bill from [facility] for your lab work.  Please note if you are on Hydroxychloroquine and and an order has been placed for a Hydroxychloroquine level, you will need to have it drawn 4 hours or more after your last dose.  If you wish to have your labs drawn at another location, please call the office 24 hours in advance so we can fax the orders.  The office is located at [address] [place], [place], [place] [address] No appointment is necessary.    If you have any questions regarding directions or hours of operation,  please call [phone].   As a reminder, please drink plenty of water prior to coming for your lab work. Thanks!

## 2022-04-15 ENCOUNTER — Other Ambulatory Visit: Payer: Self-pay | Admitting: Physician Assistant

## 2022-04-15 DIAGNOSIS — M0579 Rheumatoid arthritis with rheumatoid factor of multiple sites without organ or systems involvement: Secondary | ICD-10-CM

## 2022-04-15 DIAGNOSIS — Z79899 Other long term (current) drug therapy: Secondary | ICD-10-CM

## 2022-04-15 NOTE — Telephone Encounter (Signed)
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   TB Gold: 07/19/2021 Neg    Okay to refill Humira?

## 2022-05-02 ENCOUNTER — Other Ambulatory Visit: Payer: Self-pay | Admitting: Physician Assistant

## 2022-05-02 DIAGNOSIS — Z79899 Other long term (current) drug therapy: Secondary | ICD-10-CM

## 2022-05-02 DIAGNOSIS — M0579 Rheumatoid arthritis with rheumatoid factor of multiple sites without organ or systems involvement: Secondary | ICD-10-CM

## 2022-05-06 ENCOUNTER — Telehealth: Payer: Self-pay | Admitting: Pharmacist

## 2022-05-06 NOTE — Telephone Encounter (Signed)
Received notification from patient's insurance [facility] [employer] that HUMIRA will no longer be preferred adalimumab product on formulary. As of 05/26/2022, patient's insurance will prefer the following biosimilar(s): HYRIMOZ (Adalimumab-adaz) . Until this date patient will be able to fill Humira, but after this date, cost of Humira will increase for patient.  Discussed the switch to biosimilar with the patient. Reviewed that biosimilars are as clinically effective, as safe, and no more harmful than the originator product based on FDA studies. They are similar to generics but are different at molecular level and some require physician approval for the switch. Reviewed that biosimilars are FDA-approved and that studies are completed in patients who are clinically established on Humira and switched to biosimiliar. Discussed that dose and frequency are the same as for Humira.  Patient is in agreement to biosimilar switch from Humira to [facility]). Pharmacy team will proceed with benefits investigation for biosimilar on or after 06/25/2022. Patient received 84 day supply on 04/22/22 so will not be due for next refill until Mid-May 2024.  New start visit will not be needed for switch to HYRIMOZ [facility]). However advised patient that if any issues with biosimilar injector device, patient should contact clinic and we can schedule training visit with pharmacy team  Knox Saliva, PharmD, MPH, BCPS, CPP Clinical Pharmacist (Rheumatology and Pulmonology)

## 2022-05-13 ENCOUNTER — Other Ambulatory Visit: Payer: Self-pay | Admitting: Physician Assistant

## 2022-05-13 DIAGNOSIS — M0579 Rheumatoid arthritis with rheumatoid factor of multiple sites without organ or systems involvement: Secondary | ICD-10-CM

## 2022-05-13 DIAGNOSIS — Z79899 Other long term (current) drug therapy: Secondary | ICD-10-CM

## 2022-05-14 NOTE — Telephone Encounter (Signed)
Rx for Hyrimoz 40mg  SQ every 14 days sent to [facility]. Insurance change requiring switch from Humira to [employer] (biosimilar)  Updated labs due in April 2024  Knox Saliva, PharmD, MPH, BCPS, CPP Clinical Pharmacist (Rheumatology and Pulmonology)

## 2022-05-25 ENCOUNTER — Other Ambulatory Visit: Payer: Self-pay | Admitting: Physician Assistant

## 2022-05-26 NOTE — Telephone Encounter (Signed)
Last Fill: 03/03/2022  Labs: 03/24/2022 CBC and CMP are normal.   Next Visit: 09/04/2022  Last Visit: 03/28/2022  DX: Rheumatoid arthritis with rheumatoid factor of multiple sites without organ or systems involvement   Current Dose per office note 03/28/2022: Methotrexate 4 tablets by mouth twice weekly   Okay to refill Methotrexate?

## 2022-06-16 ENCOUNTER — Telehealth: Payer: Self-pay | Admitting: Pharmacist

## 2022-06-16 NOTE — Telephone Encounter (Signed)
Received notification from [facility] regarding a prior authorization for  HYRIMOZ . Authorization has been APPROVED from 06/16/22 to 06/16/23. Approval letter sent to scan center.  Patient can continue to fill through [facility]: [phone]  Authorization # [id number]  Chesley Mires, PharmD, MPH, BCPS, CPP Clinical Pharmacist (Rheumatology and Pulmonology)

## 2022-06-16 NOTE — Telephone Encounter (Signed)
Submitted a Prior Authorization renewal request to [facility] for  HYRIMOZ  via fax. Will update once we receive a response.  Phone: [phone] Fax: [phone] Case # [id number]  Chesley Mires, PharmD, MPH, BCPS, CPP Clinical Pharmacist (Rheumatology and Pulmonology)

## 2022-06-25 ENCOUNTER — Other Ambulatory Visit: Payer: Self-pay | Admitting: *Deleted

## 2022-06-25 DIAGNOSIS — Z79899 Other long term (current) drug therapy: Secondary | ICD-10-CM

## 2022-06-25 DIAGNOSIS — Z111 Encounter for screening for respiratory tuberculosis: Secondary | ICD-10-CM

## 2022-06-25 DIAGNOSIS — Z9225 Personal history of immunosupression therapy: Secondary | ICD-10-CM

## 2022-06-25 LAB — CBC WITH DIFFERENTIAL/PLATELET
Basophils Absolute: 13 cells/uL (ref 0–200)
Hemoglobin: 14.8 g/dL (ref 11.7–15.5)
MCH: 34.1 pg — ABNORMAL HIGH (ref 27.0–33.0)
RDW: 12.9 % (ref 11.0–15.0)

## 2022-06-26 ENCOUNTER — Other Ambulatory Visit: Payer: Self-pay | Admitting: *Deleted

## 2022-06-26 LAB — CBC WITH DIFFERENTIAL/PLATELET
Eosinophils Absolute: 33 cells/uL (ref 15–500)
Eosinophils Relative: 0.5 %
Lymphs Abs: 2795 cells/uL (ref 850–3900)
MCHC: 34.6 g/dL (ref 32.0–36.0)
MPV: 10 fL (ref 7.5–12.5)
Monocytes Relative: 3.2 %
Neutro Abs: 3452 cells/uL (ref 1500–7800)

## 2022-06-26 MED ORDER — METHOTREXATE SODIUM 2.5 MG PO TABS: ORAL_TABLET | ORAL | 0 refills | Status: DC

## 2022-06-26 NOTE — Progress Notes (Signed)
Methotrexate 3 tablets twice a week.  She is currently taking total 8 tablets of methotrexate per week.  She will be reducing it to total 6 tablets of methotrexate per week.

## 2022-06-26 NOTE — Telephone Encounter (Signed)
-----   Message from Pollyann Savoy, MD sent at 06/26/2022 11:12 AM EDT ----- Methotrexate 3 tablets twice a week.  She is currently taking total 8 tablets of methotrexate per week.  She will be reducing it to total 6 tablets of methotrexate per week.

## 2022-06-26 NOTE — Progress Notes (Signed)
CBC and CMP is stable.  ALT is mildly elevated, most likely due to methotrexate use.  Patient was doing clinically well.  She will reduce the dose of methotrexate to 3 tablets p.o. weekly.  We will continue to monitor labs.

## 2022-06-27 LAB — COMPLETE METABOLIC PANEL WITH GFR
AG Ratio: 1.8 (calc) (ref 1.0–2.5)
ALT: 32 U/L — ABNORMAL HIGH (ref 6–29)
AST: 20 U/L (ref 10–30)
Albumin: 4.2 g/dL (ref 3.6–5.1)
Alkaline phosphatase (APISO): 50 U/L (ref 31–125)
BUN: 12 mg/dL (ref 7–25)
CO2: 24 mmol/L (ref 20–32)
Calcium: 8.7 mg/dL (ref 8.6–10.2)
Chloride: 106 mmol/L (ref 98–110)
Creat: 0.82 mg/dL (ref 0.50–0.97)
Globulin: 2.3 g/dL (calc) (ref 1.9–3.7)
Glucose, Bld: 119 mg/dL — ABNORMAL HIGH (ref 65–99)
Potassium: 4 mmol/L (ref 3.5–5.3)
Sodium: 139 mmol/L (ref 135–146)
Total Bilirubin: 0.4 mg/dL (ref 0.2–1.2)
Total Protein: 6.5 g/dL (ref 6.1–8.1)
eGFR: 95 mL/min/{1.73_m2} (ref >=60)

## 2022-06-27 LAB — QUANTIFERON-TB GOLD PLUS
Mitogen-NIL: >10 IU/mL
NIL: 0.08 IU/mL
QuantiFERON-TB Gold Plus: NEGATIVE
TB1-NIL: 0.02 IU/mL
TB2-NIL: 0 IU/mL

## 2022-06-27 LAB — CBC WITH DIFFERENTIAL/PLATELET
Absolute Monocytes: 208 cells/uL (ref 200–950)
Basophils Relative: 0.2 %
HCT: 42.8 % (ref 35.0–45.0)
MCV: 98.6 fL (ref 80.0–100.0)
Neutrophils Relative %: 53.1 %
Platelets: 236 10*3/uL (ref 140–400)
RBC: 4.34 10*6/uL (ref 3.80–5.10)
Total Lymphocyte: 43 %
WBC: 6.5 10*3/uL (ref 3.8–10.8)

## 2022-06-27 NOTE — Progress Notes (Signed)
TB Gold negative

## 2022-07-08 NOTE — Telephone Encounter (Signed)
Hyrimoz copay card:   Billing information on left side is for [employer] information on right side is for HYRIMOZ (brand)  Sent info to patient via MyChart  Chesley Mires, PharmD, MPH, BCPS, CPP Clinical Pharmacist (Rheumatology and Pulmonology)

## 2022-07-17 ENCOUNTER — Other Ambulatory Visit: Payer: Self-pay | Admitting: Rheumatology

## 2022-07-17 DIAGNOSIS — Z79899 Other long term (current) drug therapy: Secondary | ICD-10-CM

## 2022-07-17 DIAGNOSIS — M0579 Rheumatoid arthritis with rheumatoid factor of multiple sites without organ or systems involvement: Secondary | ICD-10-CM

## 2022-07-17 NOTE — Telephone Encounter (Signed)
Last Fill: 05/14/2022  Labs: 06/25/2022 CBC and CMP is stable.  ALT is mildly elevated, most likely due to methotrexate use.   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

## 2022-08-24 ENCOUNTER — Other Ambulatory Visit: Payer: Self-pay | Admitting: Physician Assistant

## 2022-08-25 NOTE — Progress Notes (Signed)
Office Visit Note  Patient: Ruth Wells             Date of Birth: 07-11-1985           MRN: [id number]             PCP: Gaspar Garbe, MD Referring: Gaspar Garbe, MD Visit Date: 09/04/2022 Occupation: @GUAROCC @  Subjective:  Medication management  History of Present Illness: Ruth Wells is a 37 y.o. female with seropositive rheumatoid arthritis and osteoarthritis.  She has been on Humira since June 2023.  She was switched to Hyrimoz due to insurance issues on June 13/2024.  Patient states that she has not noticed any difference and switching from Humira to Hyrimoz.  Her dose of methotrexate was reduced to 6 tablets p.o. weekly due to elevation in the LFTs.  She has not noticed any increased joint pain or joint swelling.  She denies any morning stiffness.  Her left trochanteric bursitis improved after doing a stretching exercises.  She states she is trying to lose weight and has been exercising on a more regular basis now which includes walking and strength training.  She is also watching her diet.  She had some intentional weight loss.    Activities of Daily Living:  Patient reports morning stiffness for 0 minutes.   Patient Denies nocturnal pain.  Difficulty dressing/grooming: Denies Difficulty climbing stairs: Denies Difficulty getting out of chair: Denies Difficulty using hands for taps, buttons, cutlery, and/or writing: Denies  Review of Systems  Constitutional:  Negative for fatigue.  HENT:  Negative for mouth sores and mouth dryness.   Eyes:  Negative for dryness.  Respiratory:  Negative for shortness of breath.   Cardiovascular:  Negative for chest pain and palpitations.  Gastrointestinal:  Negative for blood in stool, constipation and diarrhea.  Endocrine: Negative for increased urination.  Genitourinary:  Negative for involuntary urination.  Musculoskeletal:  Negative for joint pain, gait problem, joint pain, joint swelling, myalgias, muscle  weakness, morning stiffness, muscle tenderness and myalgias.  Skin:  Negative for color change, rash, hair loss and sensitivity to sunlight.  Allergic/Immunologic: Negative for susceptible to infections.  Neurological:  Negative for dizziness and headaches.  Hematological:  Negative for swollen glands.  Psychiatric/Behavioral:  Negative for depressed mood and sleep disturbance. The patient is not nervous/anxious.     PMFS History:  Patient Active Problem List   Diagnosis Date Noted   Primary osteoarthritis of both hands 02/24/2016   Primary osteoarthritis of both feet 02/24/2016   High risk medication use 02/15/2016   Raynaud's syndrome without gangrene 02/15/2016   Rheumatoid arthritis with rheumatoid factor of multiple sites without organ or systems involvement (HCC) 02/15/2016    Past Medical History:  Diagnosis Date   Arthritis    Hypertension     Family History  Problem Relation Age of Onset   Arthritis Mother    Breast cancer Mother    Stroke Father    Past Surgical History:  Procedure Laterality Date   WISDOM TOOTH EXTRACTION     Social History   Social History Narrative   Not on file   Immunization History  Administered Date(s) Administered   Moderna Sars-Covid-2 Vaccination 04/06/2020   PFIZER(Purple Top)SARS-COV-2 Vaccination 05/20/2019, 06/11/2019, 10/14/2019   Pneumococcal Polysaccharide-23 02/26/2016     Objective: Vital Signs: BP 139/85 (BP Location: Left Arm, Patient Position: Sitting, Cuff Size: Normal)   Pulse 85   Resp 14   Ht 5\' 5"  (1.651 m)  Wt 165 lb 6.4 oz (75 kg)   BMI 27.52 kg/m    Physical Exam Vitals and nursing note reviewed.  Constitutional:      Appearance: She is well-developed.  HENT:     Head: Normocephalic and atraumatic.  Eyes:     Conjunctiva/sclera: Conjunctivae normal.  Cardiovascular:     Rate and Rhythm: Normal rate and regular rhythm.     Heart sounds: Normal heart sounds.  Pulmonary:     Effort: Pulmonary  effort is normal.     Breath sounds: Normal breath sounds.  Abdominal:     General: Bowel sounds are normal.     Palpations: Abdomen is soft.  Musculoskeletal:     Cervical back: Normal range of motion.  Lymphadenopathy:     Cervical: No cervical adenopathy.  Skin:    General: Skin is warm and dry.     Capillary Refill: Capillary refill takes less than 2 seconds.  Neurological:     Mental Status: She is alert and oriented to person, place, and time.  Psychiatric:        Behavior: Behavior normal.      Musculoskeletal Exam: Cervical, thoracic and lumbar spine were in good range of motion.  Shoulder joints, elbow joints, wrist joints, MCPs PIPs and DIPs in good range of motion with no synovitis.  Hip joints, knee joints, ankles were in good range of motion.  There was no tenderness or synovitis over ankles or MTPs.  CDAI Exam: CDAI Score: -- Patient Global: 0 / 100; Provider Global: 0 / 100 Swollen: --; Tender: -- Joint Exam 09/04/2022   No joint exam has been documented for this visit   There is currently no information documented on the homunculus. Go to the Rheumatology activity and complete the homunculus joint exam.  Investigation: No additional findings.  Imaging: No results found.  Recent Labs: Lab Results  Component Value Date   WBC 6.5 06/25/2022   HGB 14.8 06/25/2022   PLT 236 06/25/2022   NA 139 06/25/2022   K 4.0 06/25/2022   CL 106 06/25/2022   CO2 24 06/25/2022   GLUCOSE 119 (H) 06/25/2022   BUN 12 06/25/2022   CREATININE 0.82 06/25/2022   BILITOT 0.4 06/25/2022   ALKPHOS 42 10/07/2016   AST 20 06/25/2022   ALT 32 (H) 06/25/2022   PROT 6.5 06/25/2022   ALBUMIN 4.0 10/07/2016   CALCIUM 8.7 06/25/2022   GFRAA 103 06/14/2020   QFTBGOLDPLUS NEGATIVE 06/25/2022    Speciality Comments: PLQ Eye exam: 10/16/2021 [facility]NL At [facility] Follow up in 1 year Humira 06/23, Hyrimoz 08/07/22  Procedures:  No procedures performed Allergies: Patient  has no known allergies.   Assessment / Plan:     Visit Diagnoses: Rheumatoid arthritis with rheumatoid factor of multiple sites without organ or systems involvement (HCC) - Positive RF, negative anti-CCP erosive disease, history of JRA: Patient has done much better since she has been on the combination of Humira and methotrexate.  Humira was a started in June 2023 and was switched to higher amounts in June 2024 due to insurance issues.  She has not noticed any increased joint pain or joint swelling.  Methotrexate dose was reduced from 8 tablets p.o. weekly to 6 tablets p.o. weekly in May 2024 due to mild elevation in the LFTs.  High risk medication use -Hyrimoz 40 mg sq injections once every 14 days, Methotrexate 3 tablets by mouth twice weekly, and folic acid 2 mg by mouth daily.  Labs obtained on  Jun 25, 2022 CBC and CMP were normal except ALT was elevated at 32.  The dose of methotrexate was reduced from 8 tablets p.o. weekly to 6 tablets p.o. weekly.  Information about immunization was placed in the 80s.  She was advised to hold Hyrimoz and methotrexate if she develops an infection resume after the infection resolves.  Annual skin examination to screen for skin cancer was advised.  Use of sunscreen and sun protection was advised.  Elevated LFTs-her LFTs have been fluctuating with mild elevation.  Patient has also gained weight over the last few years.  She has modified her diet and has been exercising on a regular basis.  She had intentional weight loss.  Will continue to monitor LFTs every 3 months for now.  Possibility of fatty liver was discussed.  Primary osteoarthritis of both hands-she is currently not having any discomfort.  No PIP or DIP thickening or swelling was noted.  Primary osteoarthritis of both feet -she is followed by Dr. Al Corpus.  She denies any discomfort today.  No synovitis was noted over ankles or MTPs.  Raynaud's syndrome without gangrene-currently not at  Trochanteric  bursitis, left hip-improved after exercises.  Elevated blood pressure reading-her blood pressure was 142/96.  Repeat blood pressure was 139/85.  She was advised to monitor blood pressure closely and follow-up with the PCP if the blood pressure stays elevated.  BMI 27.52-weight loss diet and exercise was discussed.  Increased association of heart disease with rheumatoid arthritis was discussed.  Patient is trying to modify her diet.  She is also exercising on a regular basis including walking and strength training.  Orders: No orders of the defined types were placed in this encounter.  No orders of the defined types were placed in this encounter.    Follow-Up Instructions: Return in about 5 months (around 02/04/2023) for Rheumatoid arthritis, Osteoarthritis.   Pollyann Savoy, MD  Note - This record has been created using Animal nutritionist.  Chart creation errors have been sought, but may not always  have been located. Such creation errors do not reflect on  the standard of medical care.

## 2022-08-25 NOTE — Telephone Encounter (Signed)
Last Fill: 05/26/2022  Labs: 06/25/2022 CBC and CMP is stable.  ALT is mildly elevated, most likely due to methotrexate use   Next Visit: 09/04/2022  Last Visit: 03/28/2022  DX: Rheumatoid arthritis with rheumatoid factor of multiple sites without organ or systems involvement   Current Dose per lab note 06/25/2022: reduce the dose of methotrexate to 3 tablets p.o twice a week.   Okay to refill Methotrexate?

## 2022-09-04 ENCOUNTER — Encounter: Payer: Self-pay | Admitting: Rheumatology

## 2022-09-04 ENCOUNTER — Ambulatory Visit: Payer: BC Managed Care – PPO | Attending: Rheumatology | Admitting: Rheumatology

## 2022-09-04 VITALS — BP 139/85 | HR 85 | Resp 14 | Ht 65.0 in | Wt 165.4 lb

## 2022-09-04 DIAGNOSIS — R7989 Other specified abnormal findings of blood chemistry: Secondary | ICD-10-CM

## 2022-09-04 DIAGNOSIS — M19042 Primary osteoarthritis, left hand: Secondary | ICD-10-CM

## 2022-09-04 DIAGNOSIS — M19041 Primary osteoarthritis, right hand: Secondary | ICD-10-CM | POA: Diagnosis not present

## 2022-09-04 DIAGNOSIS — M0579 Rheumatoid arthritis with rheumatoid factor of multiple sites without organ or systems involvement: Secondary | ICD-10-CM

## 2022-09-04 DIAGNOSIS — M7062 Trochanteric bursitis, left hip: Secondary | ICD-10-CM

## 2022-09-04 DIAGNOSIS — Z79899 Other long term (current) drug therapy: Secondary | ICD-10-CM | POA: Diagnosis not present

## 2022-09-04 DIAGNOSIS — I7300 Raynaud's syndrome without gangrene: Secondary | ICD-10-CM

## 2022-09-04 DIAGNOSIS — M19071 Primary osteoarthritis, right ankle and foot: Secondary | ICD-10-CM

## 2022-09-04 DIAGNOSIS — R030 Elevated blood-pressure reading, without diagnosis of hypertension: Secondary | ICD-10-CM

## 2022-09-04 DIAGNOSIS — Z6827 Body mass index (BMI) 27.0-27.9, adult: Secondary | ICD-10-CM

## 2022-09-04 DIAGNOSIS — M19072 Primary osteoarthritis, left ankle and foot: Secondary | ICD-10-CM

## 2022-09-04 NOTE — Patient Instructions (Addendum)
Standing Labs We placed an order today for your standing lab work.   Please have your standing labs drawn in August and every 3 months  Please have your labs drawn 2 weeks prior to your appointment so that the provider can discuss your lab results at your appointment, if possible.  Please note that you may see your imaging and lab results in MyChart before we have reviewed them. We will contact you once all results are reviewed. Please allow our office up to 72 hours to thoroughly review all of the results before contacting the office for clarification of your results.  WALK-IN LAB HOURS  Monday through Thursday from 8:00 am -12:30 pm and 1:00 pm-5:00 pm and Friday from 8:00 am-12:00 pm.  Patients with office visits requiring labs will be seen before walk-in labs.  You may encounter longer than normal wait times. Please allow additional time. Wait times may be shorter on  Monday and Thursday afternoons.  We do not book appointments for walk-in labs. We appreciate your patience and understanding with our staff.   Labs are drawn by [facility]. Please bring your co-pay at the time of your lab draw.  You may receive a bill from [facility] for your lab work.  Please note if you are on Hydroxychloroquine and and an order has been placed for a Hydroxychloroquine level,  you will need to have it drawn 4 hours or more after your last dose.  If you wish to have your labs drawn at another location, please call the office 24 hours in advance so we can fax the orders.  The office is located at [address], [place], [place] [address]   If you have any questions regarding directions or hours of operation,  please call [phone].   As a reminder, please drink plenty of water prior to coming for your lab work. Thanks!   Vaccines You are taking a medication(s) that can suppress your immune system.  The following immunizations are recommended: Flu annually Covid-19  RSV Td/Tdap (tetanus,  diphtheria, pertussis) every 10 years Pneumonia (Prevnar 15 then Pneumovax 23 at least 1 year apart.  Alternatively, can take Prevnar 20 without needing additional dose) Shingrix: 2 doses from 4 weeks to 6 months apart If you have signs or symptoms of an infection or start antibiotics: First, call your PCP for workup of your infection. Hold your medication through the infection, until you complete your antibiotics, and until symptoms resolve if you take the following: Injectable medication (Actemra, Benlysta, Cimzia, Cosentyx, Enbrel, Humira, Kevzara, Orencia, Remicade, Simponi, Stelara, Taltz, Tremfya) Methotrexate Leflunomide (Arava) Mycophenolate (Cellcept) [place]borne [place], or Rinvoq   Please check with your PCP to make sure you are up to date.   Please get annual skin examination to screen for skin cancer while you are on Hyrimoz.  Please use sunscreen and sun protection on a regular basis.

## 2022-10-06 ENCOUNTER — Other Ambulatory Visit: Payer: Self-pay | Admitting: Rheumatology

## 2022-10-06 DIAGNOSIS — Z79899 Other long term (current) drug therapy: Secondary | ICD-10-CM

## 2022-10-06 DIAGNOSIS — M0579 Rheumatoid arthritis with rheumatoid factor of multiple sites without organ or systems involvement: Secondary | ICD-10-CM

## 2022-10-06 NOTE — Telephone Encounter (Signed)
Last Fill: 07/17/2022   Labs: 06/25/2022 CBC and CMP is stable.  ALT is mildly elevated, most likely due to methotrexate use.   TB Gold: 06/25/2022 Neg    Next Visit: 02/04/2023  Last Visit: 09/04/2022  BM:[id number] arthritis with rheumatoid factor of multiple sites without organ or systems involvement   Current Dose per office note 09/04/2022: Hyrimoz 40 mg sq injections once every 14 days   Okay to refill Hyrimoz?

## 2022-10-20 ENCOUNTER — Other Ambulatory Visit: Payer: Self-pay | Admitting: *Deleted

## 2022-10-20 DIAGNOSIS — Z79899 Other long term (current) drug therapy: Secondary | ICD-10-CM

## 2022-10-21 LAB — CBC WITH DIFFERENTIAL/PLATELET
Absolute Monocytes: 452 {cells}/uL (ref 200–950)
Basophils Absolute: 17 {cells}/uL (ref 0–200)
Basophils Relative: 0.2 %
Eosinophils Absolute: 26 {cells}/uL (ref 15–500)
Eosinophils Relative: 0.3 %
HCT: 44 % (ref 35.0–45.0)
Hemoglobin: 15.1 g/dL (ref 11.7–15.5)
Lymphs Abs: 3376 {cells}/uL (ref 850–3900)
MCH: 33.3 pg — ABNORMAL HIGH (ref 27.0–33.0)
MCHC: 34.3 g/dL (ref 32.0–36.0)
MCV: 96.9 fL (ref 80.0–100.0)
MPV: 10.4 fL (ref 7.5–12.5)
Monocytes Relative: 5.2 %
Neutro Abs: 4829 {cells}/uL (ref 1500–7800)
Neutrophils Relative %: 55.5 %
Platelets: 245 10*3/uL (ref 140–400)
RBC: 4.54 10*6/uL (ref 3.80–5.10)
RDW: 11.9 % (ref 11.0–15.0)
Total Lymphocyte: 38.8 %
WBC: 8.7 10*3/uL (ref 3.8–10.8)

## 2022-10-21 LAB — COMPLETE METABOLIC PANEL WITH GFR
AG Ratio: 1.9 (calc) (ref 1.0–2.5)
ALT: 16 U/L (ref 6–29)
AST: 16 U/L (ref 10–30)
Albumin: 4.3 g/dL (ref 3.6–5.1)
Alkaline phosphatase (APISO): 44 U/L (ref 31–125)
BUN: 9 mg/dL (ref 7–25)
CO2: 23 mmol/L (ref 20–32)
Calcium: 9.3 mg/dL (ref 8.6–10.2)
Chloride: 104 mmol/L (ref 98–110)
Creat: 0.85 mg/dL (ref 0.50–0.97)
Globulin: 2.3 g/dL (ref 1.9–3.7)
Glucose, Bld: 79 mg/dL (ref 65–99)
Potassium: 4.3 mmol/L (ref 3.5–5.3)
Sodium: 138 mmol/L (ref 135–146)
Total Bilirubin: 0.5 mg/dL (ref 0.2–1.2)
Total Protein: 6.6 g/dL (ref 6.1–8.1)
eGFR: 91 mL/min/{1.73_m2} (ref >=60)

## 2022-10-21 NOTE — Progress Notes (Signed)
CBC and CMP are normal.

## 2022-10-30 ENCOUNTER — Encounter: Payer: Self-pay | Admitting: Rheumatology

## 2022-12-13 ENCOUNTER — Other Ambulatory Visit: Payer: Self-pay | Admitting: Physician Assistant

## 2022-12-15 NOTE — Telephone Encounter (Signed)
Last Fill: 08/25/2022  Labs: 10/20/2022 CBC and CMP are normal.   Next Visit: 02/04/2023  Last Visit: 09/04/2022  DX:  Rheumatoid arthritis with rheumatoid factor of multiple sites without organ or systems involvement   Current Dose per office note 09/04/2022: Methotrexate 3 tablets by mouth twice weekly   Okay to refill Methotrexate?

## 2022-12-25 ENCOUNTER — Other Ambulatory Visit: Payer: Self-pay | Admitting: Physician Assistant

## 2022-12-25 DIAGNOSIS — M0579 Rheumatoid arthritis with rheumatoid factor of multiple sites without organ or systems involvement: Secondary | ICD-10-CM

## 2022-12-25 DIAGNOSIS — Z79899 Other long term (current) drug therapy: Secondary | ICD-10-CM

## 2022-12-25 NOTE — Telephone Encounter (Signed)
Last Fill: 10/06/2022  Labs: 10/20/2022 CBC and CMP are normal.   TB Gold: 06/25/2022 Neg    Next Visit: 02/04/2023  Last Visit: 09/04/2022  DX: Rheumatoid arthritis with rheumatoid factor of multiple sites without organ or systems involvement   Current Dose per office note 09/04/2022: Hyrimoz 40 mg sq injections once every 14 days   Okay to refill Hyrimoz?

## 2023-01-14 ENCOUNTER — Other Ambulatory Visit: Payer: Self-pay | Admitting: *Deleted

## 2023-01-14 DIAGNOSIS — Z79899 Other long term (current) drug therapy: Secondary | ICD-10-CM

## 2023-01-15 LAB — CBC WITH DIFFERENTIAL/PLATELET
Absolute Lymphocytes: 3908 {cells}/uL — ABNORMAL HIGH (ref 850–3900)
Absolute Monocytes: 429 {cells}/uL (ref 200–950)
Basophils Absolute: 8 {cells}/uL (ref 0–200)
Basophils Relative: 0.1 %
Eosinophils Absolute: 78 {cells}/uL (ref 15–500)
Eosinophils Relative: 1 %
HCT: 44.5 % (ref 35.0–45.0)
Hemoglobin: 14.8 g/dL (ref 11.7–15.5)
MCH: 32.6 pg (ref 27.0–33.0)
MCHC: 33.3 g/dL (ref 32.0–36.0)
MCV: 98 fL (ref 80.0–100.0)
MPV: 10.1 fL (ref 7.5–12.5)
Monocytes Relative: 5.5 %
Neutro Abs: 3377 {cells}/uL (ref 1500–7800)
Neutrophils Relative %: 43.3 %
Platelets: 257 10*3/uL (ref 140–400)
RBC: 4.54 10*6/uL (ref 3.80–5.10)
RDW: 12.5 % (ref 11.0–15.0)
Total Lymphocyte: 50.1 %
WBC: 7.8 10*3/uL (ref 3.8–10.8)

## 2023-01-15 LAB — COMPLETE METABOLIC PANEL WITH GFR
AG Ratio: 1.6 (calc) (ref 1.0–2.5)
ALT: 9 U/L (ref 6–29)
AST: 14 U/L (ref 10–30)
Albumin: 4.1 g/dL (ref 3.6–5.1)
Alkaline phosphatase (APISO): 46 U/L (ref 31–125)
BUN: 11 mg/dL (ref 7–25)
CO2: 25 mmol/L (ref 20–32)
Calcium: 8.8 mg/dL (ref 8.6–10.2)
Chloride: 105 mmol/L (ref 98–110)
Creat: 0.95 mg/dL (ref 0.50–0.97)
Globulin: 2.5 g/dL (ref 1.9–3.7)
Glucose, Bld: 70 mg/dL (ref 65–99)
Potassium: 4.3 mmol/L (ref 3.5–5.3)
Sodium: 138 mmol/L (ref 135–146)
Total Bilirubin: 0.4 mg/dL (ref 0.2–1.2)
Total Protein: 6.6 g/dL (ref 6.1–8.1)
eGFR: 80 mL/min/{1.73_m2} (ref >=60)

## 2023-01-15 NOTE — Progress Notes (Signed)
CBC and CMP are normal.

## 2023-01-21 NOTE — Progress Notes (Unsigned)
Office Visit Note  Patient: Ruth Wells             Date of Birth: 02-Jun-1985           MRN: [id number]             PCP: Gaspar Garbe, MD Referring: Gaspar Garbe, MD Visit Date: 02/04/2023 Occupation: @GUAROCC @  Subjective:  Medication monitoring    History of Present Illness: DAROTHY CHAPPELLE is a 37 y.o. female with history of seropositive rheumatoid arthritis and osteoarthritis.  Patient remains on  Hyrimoz 40 mg sq injections once every 14 days, Methotrexate 3 tablets by mouth twice weekly, and folic acid 2 mg by mouth daily.  She has been tolerating combination therapy without any side effects and has not had any injection site reactions from Hyrimoz.  She denies any signs or symptoms of a rheumatoid arthritis flare.  She has not been experiencing any morning stiffness, nocturnal pain, or difficulty with ADLs.  She has not needed to take any over-the-counter products for pain relief.  She denies any recent gaps in therapy.  Patient states that she had COVID-19 in September 2024 but is otherwise not had any other interruptions in therapy.  She denies any other recent or recurrent infections.  Patient is up-to-date with her annual flu shot and is planning to get her COVID vaccine once eligible.   Activities of Daily Living:  Patient reports morning stiffness for 0  none .   Patient Denies nocturnal pain.  Difficulty dressing/grooming: Denies Difficulty climbing stairs: Denies Difficulty getting out of chair: Denies Difficulty using hands for taps, buttons, cutlery, and/or writing: Denies  Review of Systems  Constitutional:  Negative for fatigue.  HENT:  Negative for mouth sores and mouth dryness.   Eyes:  Negative for dryness.  Respiratory:  Negative for shortness of breath.   Cardiovascular:  Negative for chest pain and palpitations.  Gastrointestinal:  Negative for blood in stool, constipation and diarrhea.  Endocrine: Negative for increased urination.   Genitourinary:  Negative for involuntary urination.  Musculoskeletal:  Negative for joint pain, gait problem, joint pain, joint swelling, myalgias, muscle weakness, morning stiffness, muscle tenderness and myalgias.  Skin:  Positive for sensitivity to sunlight. Negative for color change, rash and hair loss.  Allergic/Immunologic: Negative for susceptible to infections.  Neurological:  Negative for dizziness and headaches.  Hematological:  Negative for swollen glands.  Psychiatric/Behavioral:  Negative for depressed mood and sleep disturbance. The patient is not nervous/anxious.     PMFS History:  Patient Active Problem List   Diagnosis Date Noted   Primary osteoarthritis of both hands 02/24/2016   Primary osteoarthritis of both feet 02/24/2016   High risk medication use 02/15/2016   Raynaud's syndrome without gangrene 02/15/2016   Rheumatoid arthritis with rheumatoid factor of multiple sites without organ or systems involvement (HCC) 02/15/2016    Past Medical History:  Diagnosis Date   Arthritis    Hypertension     Family History  Problem Relation Age of Onset   Arthritis Mother    Breast cancer Mother    Stroke Father    Past Surgical History:  Procedure Laterality Date   WISDOM TOOTH EXTRACTION     Social History   Social History Narrative   Not on file   Immunization History  Administered Date(s) Administered   Moderna Sars-Covid-2 Vaccination 04/06/2020   PFIZER(Purple Top)SARS-COV-2 Vaccination 05/20/2019, 06/11/2019, 10/14/2019   Pneumococcal Polysaccharide-23 02/26/2016     Objective: Vital Signs:  BP 132/81 (BP Location: Left Arm, Patient Position: Sitting, Cuff Size: Normal)   Pulse 97   Resp 14   Ht 5\' 5"  (1.651 m)   Wt 174 lb (78.9 kg)   BMI 28.96 kg/m    Physical Exam Vitals and nursing note reviewed.  Constitutional:      Appearance: She is well-developed.  HENT:     Head: Normocephalic and atraumatic.  Eyes:     Conjunctiva/sclera:  Conjunctivae normal.  Cardiovascular:     Rate and Rhythm: Normal rate and regular rhythm.     Heart sounds: Normal heart sounds.  Pulmonary:     Effort: Pulmonary effort is normal.     Breath sounds: Normal breath sounds.  Abdominal:     General: Bowel sounds are normal.     Palpations: Abdomen is soft.  Musculoskeletal:     Cervical back: Normal range of motion.  Lymphadenopathy:     Cervical: No cervical adenopathy.  Skin:    General: Skin is warm and dry.     Capillary Refill: Capillary refill takes less than 2 seconds.  Neurological:     Mental Status: She is alert and oriented to person, place, and time.  Psychiatric:        Behavior: Behavior normal.      Musculoskeletal Exam: C-spine, thoracic spine, lumbar spine and good range of motion.  Shoulder joints, elbow joints, wrist joints, MCPs, PIPs, DIPs have good range of motion with no synovitis.  Complete fist formation bilaterally.  Hip joints have good range of motion with no groin pain.  Knee joints have good range of motion no warmth or effusion.  Ankle joints have good range of motion with no tenderness or joint swelling.  CDAI Exam: CDAI Score: -- Patient Global: 0 / 100; Provider Global: 0 / 100 Swollen: --; Tender: -- Joint Exam 02/04/2023   No joint exam has been documented for this visit   There is currently no information documented on the homunculus. Go to the Rheumatology activity and complete the homunculus joint exam.  Investigation: No additional findings.  Imaging: No results found.  Recent Labs: Lab Results  Component Value Date   WBC 7.8 01/14/2023   HGB 14.8 01/14/2023   PLT 257 01/14/2023   NA 138 01/14/2023   K 4.3 01/14/2023   CL 105 01/14/2023   CO2 25 01/14/2023   GLUCOSE 70 01/14/2023   BUN 11 01/14/2023   CREATININE 0.95 01/14/2023   BILITOT 0.4 01/14/2023   ALKPHOS 42 10/07/2016   AST 14 01/14/2023   ALT 9 01/14/2023   PROT 6.6 01/14/2023   ALBUMIN 4.0 10/07/2016    CALCIUM 8.8 01/14/2023   GFRAA 103 06/14/2020   QFTBGOLDPLUS NEGATIVE 06/25/2022    Speciality Comments: PLQ Eye exam: 10/16/2021 WNL At [facility] Follow up in 1 year Humira 06/23, Hyrimoz 08/07/22  Procedures:  No procedures performed Allergies: Patient has no known allergies.   Assessment / Plan:     Visit Diagnoses: Rheumatoid arthritis with rheumatoid factor of multiple sites without organ or systems involvement (HCC) - Positive RF, negative anti-CCP erosive disease, history of JRA: She has no joint tenderness or synovitis on examination today.  She has not had any signs or symptoms of a rheumatoid arthritis flare.  She has clinically been doing well on Hyrimoz 40 mg sq injections every 14 days and methotrexate 3 tablets by mouth twice weekly.  She is tolerating combination therapy without any side effects or injection site reactions from Hyrimoz.  No recent interruptions in therapy.  No morning stiffness, nocturnal pain, or difficulty with ADLs.  No medication changes will be made at this time.  She was advised to notify us if she develops signs or symptoms of a flare.  She will follow up in 5 months or sooner if needed.   High risk medication use - Hyrimoz 40 mg sq injections once every 14 days, Methotrexate 3 tablets by mouth twice weekly, and folic acid 2 mg by mouth daily. CBC and CMP updated on 01/14/23.  Her next lab work will be due in February and every 3 months.   TB gold negative on 06/25/22.  No recent or recurrent infections.  Discussed the importance of holding hyrimoz and methotrexate if she develops signs or symptoms of an infection and to resume once the infection has completely cleared.  Up-to-date with annual flu shot.  Planning to receive the COVID-vaccine once eligible.  Elevated LFTs: LFTs were within normal limits on 01/14/2023.  Primary osteoarthritis of both hands: No tenderness or inflammation noted on examination today.  She was able to make a complete  fist bilaterally.  Primary osteoarthritis of both feet - Dr. Bradly Chris recent flares.  She is wearing proper fitting shoes.  Raynaud's syndrome without gangrene: Not currently symptomatic.  No signs of sclerodactyly.  Orders: No orders of the defined types were placed in this encounter.  No orders of the defined types were placed in this encounter.   Follow-Up Instructions: Return in about 5 months (around 07/05/2023) for Rheumatoid arthritis, Osteoarthritis.   Gearldine Bienenstock, PA-C  Note - This record has been created using Dragon software.  Chart creation errors have been sought, but may not always  have been located. Such creation errors do not reflect on  the standard of medical care.

## 2023-02-04 ENCOUNTER — Encounter: Payer: Self-pay | Admitting: Physician Assistant

## 2023-02-04 ENCOUNTER — Ambulatory Visit: Payer: BC Managed Care – PPO | Attending: Physician Assistant | Admitting: Physician Assistant

## 2023-02-04 VITALS — BP 132/81 | HR 97 | Resp 14 | Ht 65.0 in | Wt 174.0 lb

## 2023-02-04 DIAGNOSIS — M19041 Primary osteoarthritis, right hand: Secondary | ICD-10-CM

## 2023-02-04 DIAGNOSIS — M19071 Primary osteoarthritis, right ankle and foot: Secondary | ICD-10-CM

## 2023-02-04 DIAGNOSIS — M0579 Rheumatoid arthritis with rheumatoid factor of multiple sites without organ or systems involvement: Secondary | ICD-10-CM

## 2023-02-04 DIAGNOSIS — M19042 Primary osteoarthritis, left hand: Secondary | ICD-10-CM

## 2023-02-04 DIAGNOSIS — Z79899 Other long term (current) drug therapy: Secondary | ICD-10-CM | POA: Diagnosis not present

## 2023-02-04 DIAGNOSIS — M19072 Primary osteoarthritis, left ankle and foot: Secondary | ICD-10-CM

## 2023-02-04 DIAGNOSIS — I7300 Raynaud's syndrome without gangrene: Secondary | ICD-10-CM

## 2023-02-04 DIAGNOSIS — R7989 Other specified abnormal findings of blood chemistry: Secondary | ICD-10-CM

## 2023-02-04 NOTE — Patient Instructions (Signed)

## 2023-02-26 ENCOUNTER — Other Ambulatory Visit (HOSPITAL_COMMUNITY): Payer: Self-pay

## 2023-02-26 ENCOUNTER — Telehealth: Payer: Self-pay | Admitting: Pharmacist

## 2023-02-26 NOTE — Telephone Encounter (Signed)
 Patient has changed insurance ID number. Submitted a Prior Authorization request to [facility] for  HYRIMOZ  via CoverMyMeds. Pending clinical questions to populate  Key: [id number]

## 2023-02-27 NOTE — Telephone Encounter (Signed)
 Per automated response on CMM: Your PA has been resolved, no additional PA is required. For further inquiries please contact the number on the back of the member prescription card. (Message [phone])  Authorization was transferred from previous plan.  Sherry Pennant, PharmD, MPH, BCPS, CPP Clinical Pharmacist (Rheumatology and Pulmonology)

## 2023-03-07 ENCOUNTER — Other Ambulatory Visit: Payer: Self-pay | Admitting: Physician Assistant

## 2023-03-09 NOTE — Telephone Encounter (Signed)
 Last Fill: 12/15/2022  Labs: 01/14/2023 CBC and CMP are normal.   Next Visit: 07/16/2023  Last Visit: 02/04/2023  DX:  Rheumatoid arthritis with rheumatoid factor of multiple sites without organ or systems involvement   Current Dose per office note 02/04/2023: Methotrexate  3 tablets by mouth twice weekly   Okay to refill Methotrexate ?

## 2023-04-02 ENCOUNTER — Other Ambulatory Visit: Payer: Self-pay | Admitting: Physician Assistant

## 2023-04-02 DIAGNOSIS — Z79899 Other long term (current) drug therapy: Secondary | ICD-10-CM

## 2023-04-02 DIAGNOSIS — M0579 Rheumatoid arthritis with rheumatoid factor of multiple sites without organ or systems involvement: Secondary | ICD-10-CM

## 2023-04-02 NOTE — Telephone Encounter (Signed)
 Last Fill: 12/25/2022  Labs: 01/14/2023 CBC and CMP are normal.   TB Gold: 06/25/2022   Next Visit: 07/16/2023  Last Visit: 02/04/2023   DX: Rheumatoid arthritis with rheumatoid factor of multiple sites without organ or systems involvement   Current Dose per office note 02/04/2023: Hyrimoz  40 mg sq injections once every 14 days   Okay to refill Hyrimoz ?

## 2023-04-15 ENCOUNTER — Other Ambulatory Visit: Payer: Self-pay | Admitting: Physician Assistant

## 2023-04-16 ENCOUNTER — Other Ambulatory Visit: Payer: Self-pay | Admitting: *Deleted

## 2023-04-16 DIAGNOSIS — Z111 Encounter for screening for respiratory tuberculosis: Secondary | ICD-10-CM

## 2023-04-16 DIAGNOSIS — Z79899 Other long term (current) drug therapy: Secondary | ICD-10-CM

## 2023-04-16 DIAGNOSIS — Z13220 Encounter for screening for lipoid disorders: Secondary | ICD-10-CM

## 2023-04-16 DIAGNOSIS — Z9225 Personal history of immunosupression therapy: Secondary | ICD-10-CM

## 2023-04-16 NOTE — Telephone Encounter (Signed)
Last Fill: 01/23/2022  Next Visit: 07/16/2023  Last Visit: 02/04/2023  Dx: Rheumatoid arthritis with rheumatoid factor of multiple sites without organ or systems involvement   Current Dose per office note on 02/04/2023: folic acid 2 mg by mouth daily.   Okay to refill Folic Acid?

## 2023-04-17 LAB — COMPLETE METABOLIC PANEL WITH GFR
AG Ratio: 1.8 (calc) (ref 1.0–2.5)
ALT: 15 U/L (ref 6–29)
AST: 18 U/L (ref 10–30)
Albumin: 4.2 g/dL (ref 3.6–5.1)
Alkaline phosphatase (APISO): 49 U/L (ref 31–125)
BUN: 12 mg/dL (ref 7–25)
CO2: 26 mmol/L (ref 20–32)
Calcium: 8.5 mg/dL — ABNORMAL LOW (ref 8.6–10.2)
Chloride: 104 mmol/L (ref 98–110)
Creat: 0.79 mg/dL (ref 0.50–0.97)
Globulin: 2.3 g/dL (ref 1.9–3.7)
Glucose, Bld: 80 mg/dL (ref 65–99)
Potassium: 4.2 mmol/L (ref 3.5–5.3)
Sodium: 137 mmol/L (ref 135–146)
Total Bilirubin: 0.4 mg/dL (ref 0.2–1.2)
Total Protein: 6.5 g/dL (ref 6.1–8.1)
eGFR: 99 mL/min/{1.73_m2} (ref >=60)

## 2023-04-17 LAB — CBC WITH DIFFERENTIAL/PLATELET
Absolute Lymphocytes: 3437 {cells}/uL (ref 850–3900)
Absolute Monocytes: 595 {cells}/uL (ref 200–950)
Basophils Absolute: 7 {cells}/uL (ref 0–200)
Basophils Relative: 0.1 %
Eosinophils Absolute: 119 {cells}/uL (ref 15–500)
Eosinophils Relative: 1.7 %
HCT: 44 % (ref 35.0–45.0)
Hemoglobin: 14.8 g/dL (ref 11.7–15.5)
MCH: 33.1 pg — ABNORMAL HIGH (ref 27.0–33.0)
MCHC: 33.6 g/dL (ref 32.0–36.0)
MCV: 98.4 fL (ref 80.0–100.0)
MPV: 10.2 fL (ref 7.5–12.5)
Monocytes Relative: 8.5 %
Neutro Abs: 2842 {cells}/uL (ref 1500–7800)
Neutrophils Relative %: 40.6 %
Platelets: 268 10*3/uL (ref 140–400)
RBC: 4.47 10*6/uL (ref 3.80–5.10)
RDW: 12.5 % (ref 11.0–15.0)
Total Lymphocyte: 49.1 %
WBC: 7 10*3/uL (ref 3.8–10.8)

## 2023-04-17 LAB — LIPID PANEL
Cholesterol: 217 mg/dL — ABNORMAL HIGH (ref <200)
HDL: 45 mg/dL — ABNORMAL LOW (ref >=50)
LDL Cholesterol (Calc): 142 mg/dL — ABNORMAL HIGH
Non-HDL Cholesterol (Calc): 172 mg/dL — ABNORMAL HIGH (ref <130)
Total CHOL/HDL Ratio: 4.8 (calc) (ref <5.0)
Triglycerides: 164 mg/dL — ABNORMAL HIGH (ref <150)

## 2023-04-17 NOTE — Progress Notes (Signed)
Triglycerides elevated, LDL elevated, HDL low CBC normal, CMP shows low calcium.  Patient should increase calcium intake to 1200 mg daily.  Please forward results to her PCP.

## 2023-04-19 DIAGNOSIS — M0579 Rheumatoid arthritis with rheumatoid factor of multiple sites without organ or systems involvement: Secondary | ICD-10-CM

## 2023-04-19 DIAGNOSIS — Z79899 Other long term (current) drug therapy: Secondary | ICD-10-CM

## 2023-04-20 MED ORDER — ADALIMUMAB-ADAZ 40 MG/0.4ML ~~LOC~~ SOAJ: 0.4000 mL | SUBCUTANEOUS | 0 refills | Status: DC

## 2023-04-20 NOTE — Telephone Encounter (Signed)
 Last Fill: 04/02/2023  Labs: 04/16/2023 CBC normal, CMP shows low calcium.   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

## 2023-05-05 ENCOUNTER — Telehealth: Payer: Self-pay | Admitting: Pharmacist

## 2023-05-05 NOTE — Telephone Encounter (Signed)
 Submitted a Prior Authorization request to [facility], [place] for  HYRIMOZ  via CoverMyMeds. Pending clinical questions to populate  Key: [id number]   This PA is for 84 day supply

## 2023-05-06 NOTE — Telephone Encounter (Signed)
 Clinical questions for Hyrimoz PA completed

## 2023-05-08 NOTE — Telephone Encounter (Signed)
 Received notification from [facility] regarding a prior authorization for  HYRIMOZ . Authorization has been APPROVED from 05/06/23 to 05/06/23. Approval letter sent to scan center.  Patient must continue to fill through [facility]: [phone]  Authorization # [id number]  Chesley Mires, PharmD, MPH, BCPS, CPP Clinical Pharmacist (Rheumatology and Pulmonology)

## 2023-05-29 ENCOUNTER — Other Ambulatory Visit: Payer: Self-pay | Admitting: Rheumatology

## 2023-05-29 NOTE — Telephone Encounter (Signed)
 Last Fill: 03/09/2023  Labs: 04/16/2023 Triglycerides elevated, LDL elevated, HDL low CBC normal, CMP shows low calcium.   Next Visit: 07/16/2023  Last Visit: 02/04/2023  DX: Rheumatoid arthritis with rheumatoid factor of multiple sites without organ or systems involvement   Current Dose per office note 02/04/2023: Methotrexate 3 tablets by mouth twice weekly   Okay to refill Methotrexate?

## 2023-07-02 ENCOUNTER — Other Ambulatory Visit: Payer: Self-pay | Admitting: Rheumatology

## 2023-07-02 DIAGNOSIS — Z79899 Other long term (current) drug therapy: Secondary | ICD-10-CM

## 2023-07-02 DIAGNOSIS — M0579 Rheumatoid arthritis with rheumatoid factor of multiple sites without organ or systems involvement: Secondary | ICD-10-CM

## 2023-07-02 NOTE — Telephone Encounter (Signed)
 Last Fill: 04/20/2023  Labs: 04/16/2023 CBC normal, CMP shows low calcium.   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

## 2023-07-03 NOTE — Progress Notes (Signed)
 Office Visit Note  Patient: Ruth Wells             Date of Birth: 06-Sep-1985           MRN: [id number]             PCP: Suzzanne Estrin, MD Referring: Suzzanne Estrin, MD Visit Date: 07/16/2023 Occupation: @GUAROCC @  Subjective:  Follow-up   History of Present Illness: Ruth Wells is a 38 y.o. female with seropositive rheumatoid arthritis and osteoarthritis.  She states she developed upper respiratory tract infection last month.  She was treated with antibiotics.  She states she had productive cough.  Had a chest x-ray which was unremarkable per patient.  She came off Hyrimoz  40 mg subcu every other week April 13 and resumed on May 11.  She was off methotrexate  from April 26 until May 11.  She did not develop a flare while she was off the medications.  She denies any joint pain or joint stiffness today.  Activities of Daily Living:  Patient reports morning stiffness for 0 minute.   Patient Denies nocturnal pain.  Difficulty dressing/grooming: Denies Difficulty climbing stairs: Denies Difficulty getting out of chair: Denies Difficulty using hands for taps, buttons, cutlery, and/or writing: Denies  Review of Systems  Constitutional:  Positive for fatigue.  HENT:  Positive for mouth sores. Negative for mouth dryness.   Eyes:  Negative for dryness.  Respiratory:  Negative for shortness of breath.   Cardiovascular:  Negative for chest pain and palpitations.  Gastrointestinal:  Negative for blood in stool, constipation and diarrhea.  Endocrine: Negative for increased urination.  Genitourinary:  Negative for involuntary urination.  Musculoskeletal:  Positive for myalgias and myalgias. Negative for joint pain, gait problem, joint pain, joint swelling, muscle weakness, morning stiffness and muscle tenderness.  Skin:  Positive for sensitivity to sunlight. Negative for color change, rash and hair loss.  Allergic/Immunologic: Negative for susceptible to infections.   Neurological:  Positive for headaches. Negative for dizziness.  Hematological:  Negative for swollen glands.  Psychiatric/Behavioral:  Negative for depressed mood and sleep disturbance. The patient is nervous/anxious.     PMFS History:  Patient Active Problem List   Diagnosis Date Noted   Primary osteoarthritis of both hands 02/24/2016   Primary osteoarthritis of both feet 02/24/2016   High risk medication use 02/15/2016   Raynaud's syndrome without gangrene 02/15/2016   Rheumatoid arthritis with rheumatoid factor of multiple sites without organ or systems involvement (HCC) 02/15/2016    Past Medical History:  Diagnosis Date   Arthritis    Hypertension     Family History  Problem Relation Age of Onset   Arthritis Mother    Breast cancer Mother    Stroke Father    Celiac disease Father    Past Surgical History:  Procedure Laterality Date   WISDOM TOOTH EXTRACTION     Social History   Social History Narrative   Not on file   Immunization History  Administered Date(s) Administered   Moderna Sars-Covid-2 Vaccination 04/06/2020   PFIZER(Purple Top)SARS-COV-2 Vaccination 05/20/2019, 06/11/2019, 10/14/2019   Pneumococcal Polysaccharide-23 02/26/2016     Objective: Vital Signs: BP 121/89 (BP Location: Left Arm, Patient Position: Sitting, Cuff Size: Normal)   Pulse 77   Resp 14   Ht 5\' 5"  (1.651 m)   Wt 174 lb (78.9 kg)   BMI 28.96 kg/m    Physical Exam Vitals and nursing note reviewed.  Constitutional:      Appearance:  She is well-developed.  HENT:     Head: Normocephalic and atraumatic.  Eyes:     Conjunctiva/sclera: Conjunctivae normal.  Cardiovascular:     Rate and Rhythm: Normal rate and regular rhythm.     Heart sounds: Normal heart sounds.  Pulmonary:     Effort: Pulmonary effort is normal.     Breath sounds: Normal breath sounds.  Abdominal:     General: Bowel sounds are normal.     Palpations: Abdomen is soft.  Musculoskeletal:     Cervical back:  Normal range of motion.  Lymphadenopathy:     Cervical: No cervical adenopathy.  Skin:    General: Skin is warm and dry.     Capillary Refill: Capillary refill takes less than 2 seconds.  Neurological:     Mental Status: She is alert and oriented to person, place, and time.  Psychiatric:        Behavior: Behavior normal.      Musculoskeletal Exam: Cervical, thoracic and lumbar spine with good range of motion.  Shoulders, elbows, wrist joints, MCPs PIPs and DIPs with good range of motion with no synovitis.  Hip joints and knee joints in good range of motion without any warmth swelling or effusion.  There is no tenderness over ankles or MTPs.  CDAI Exam: CDAI Score: -- Patient Global: --; Provider Global: -- Swollen: --; Tender: -- Joint Exam 07/16/2023   No joint exam has been documented for this visit   There is currently no information documented on the homunculus. Go to the Rheumatology activity and complete the homunculus joint exam.  Investigation: No additional findings.  Imaging: No results found.  Recent Labs: Lab Results  Component Value Date   WBC 7.0 04/16/2023   HGB 14.8 04/16/2023   PLT 268 04/16/2023   NA 137 04/16/2023   K 4.2 04/16/2023   CL 104 04/16/2023   CO2 26 04/16/2023   GLUCOSE 80 04/16/2023   BUN 12 04/16/2023   CREATININE 0.79 04/16/2023   BILITOT 0.4 04/16/2023   ALKPHOS 42 10/07/2016   AST 18 04/16/2023   ALT 15 04/16/2023   PROT 6.5 04/16/2023   ALBUMIN 4.0 10/07/2016   CALCIUM 8.5 (L) 04/16/2023   GFRAA 103 06/14/2020   QFTBGOLDPLUS NEGATIVE 06/25/2022   April 16, 2023 LDL 142, triglycerides 164, HDL 45  05/16/ 2025 WBC 5.9, hemoglobin 15.4, platelets 212, CMP creatinine 0.9, GFR 70.5, AST 18, ALT 22, cholesterol 204, HDL 39, LDL 137, LDL HDL ratio 3.5  Speciality Comments: PLQ Eye exam: 10/16/2021 [facility] Follow up in 1 year Humira  06/23, Hyrimoz  08/07/22  Procedures:  No procedures  performed Allergies: Patient has no known allergies.   Assessment / Plan:     Visit Diagnoses: Rheumatoid arthritis with rheumatoid factor of multiple sites without organ or systems involvement (HCC) - Positive RF, negative anti-CCP erosive disease, history of JRA: Patient had no synovitis on the examination.  She denies any flares of rheumatoid arthritis.  She had to discontinue had a month send methotrexate  for a few weeks due to upper respiratory tract infection.  She was treated with antibiotics.  She states she had severe productive cough which resolved.  She resumed both medications on May 11.  High risk medication use - Hyrimoz  40 mg sq injections once every 14 days, Methotrexate  3 tablets by mouth twice weekly, and folic acid  2 mg by mouth daily. - 05/16/ 2025 WBC 5.9, hemoglobin 15.4, platelets 212, CMP creatinine 0.9, GFR 70.5, AST 18,  ALT 22, will obtain at her PCPs office.  Plan: QuantiFERON-TB Gold Plus, today.  She was advised to get labs every 3 months.  Information on immunization was placed in the AVS.  She was advised to hold her Raynauds and methotrexate  if she develops an infection resume after the infection resolves.  Annual skin examination to screen for skin cancer was advised.  Use of sunscreen and sun protection was advised.  Primary osteoarthritis of both hands-she had no tenderness or swelling on the examination.  Primary osteoarthritis of both feet -currently not symptomatic.  She is seen Dr. Lara Plants in the past.  Raynaud's syndrome without gangrene-not symptomatic.  Dyslipidemia-her LDL and triglycerides have been elevated.  Increases association of heart disease with rheumatoid arthritis was discussed.  Dietary modifications and exercise was emphasized.  A handout was placed in the AVS.  Family history of celiac disease-father.  Patient states her father was recently diagnosed with celiac disease.  Family history of ankylosing spondylitis-paternal aunt  Family history  of rheumatoid arthritis-maternal grandmother  Orders: Orders Placed This Encounter  Procedures   QuantiFERON-TB Gold Plus   No orders of the defined types were placed in this encounter.    Follow-Up Instructions: Return in about 5 months (around 12/16/2023) for Rheumatoid arthritis, Osteoarthritis.   Nicholas Bari, MD  Note - This record has been created using Animal nutritionist.  Chart creation errors have been sought, but may not always  have been located. Such creation errors do not reflect on  the standard of medical care.

## 2023-07-16 ENCOUNTER — Encounter: Payer: Self-pay | Admitting: Rheumatology

## 2023-07-16 ENCOUNTER — Ambulatory Visit: Payer: BC Managed Care – PPO | Attending: Rheumatology | Admitting: Rheumatology

## 2023-07-16 VITALS — BP 121/89 | HR 77 | Resp 14 | Ht 65.0 in | Wt 174.0 lb

## 2023-07-16 DIAGNOSIS — Z8379 Family history of other diseases of the digestive system: Secondary | ICD-10-CM

## 2023-07-16 DIAGNOSIS — M19071 Primary osteoarthritis, right ankle and foot: Secondary | ICD-10-CM | POA: Diagnosis not present

## 2023-07-16 DIAGNOSIS — M0579 Rheumatoid arthritis with rheumatoid factor of multiple sites without organ or systems involvement: Secondary | ICD-10-CM | POA: Diagnosis not present

## 2023-07-16 DIAGNOSIS — Z79899 Other long term (current) drug therapy: Secondary | ICD-10-CM | POA: Diagnosis not present

## 2023-07-16 DIAGNOSIS — M19041 Primary osteoarthritis, right hand: Secondary | ICD-10-CM

## 2023-07-16 DIAGNOSIS — M19072 Primary osteoarthritis, left ankle and foot: Secondary | ICD-10-CM

## 2023-07-16 DIAGNOSIS — I7300 Raynaud's syndrome without gangrene: Secondary | ICD-10-CM

## 2023-07-16 DIAGNOSIS — M19042 Primary osteoarthritis, left hand: Secondary | ICD-10-CM

## 2023-07-16 DIAGNOSIS — R7989 Other specified abnormal findings of blood chemistry: Secondary | ICD-10-CM

## 2023-07-16 DIAGNOSIS — Z8269 Family history of other diseases of the musculoskeletal system and connective tissue: Secondary | ICD-10-CM

## 2023-07-16 DIAGNOSIS — Z8261 Family history of arthritis: Secondary | ICD-10-CM

## 2023-07-16 DIAGNOSIS — E785 Hyperlipidemia, unspecified: Secondary | ICD-10-CM

## 2023-07-16 NOTE — Patient Instructions (Addendum)
 Standing Labs We placed an order today for your standing lab work.   Please have your standing labs drawn in August and every 3 months  Please have your labs drawn 2 weeks prior to your appointment so that the provider can discuss your lab results at your appointment, if possible.  Please note that you may see your imaging and lab results in MyChart before we have reviewed them. We will contact you once all results are reviewed. Please allow our office up to 72 hours to thoroughly review all of the results before contacting the office for clarification of your results.  WALK-IN LAB HOURS  Monday through Thursday from 8:00 am -12:30 pm and 1:00 pm-4:00 pm and Friday from 8:00 am-12:00 pm.  Patients with office visits requiring labs will be seen before walk-in labs.  You may encounter longer than normal wait times. Please allow additional time. Wait times may be shorter on  Monday and Thursday afternoons.  We do not book appointments for walk-in labs. We appreciate your patience and understanding with our staff.   Labs are drawn by [facility]. Please bring your co-pay at the time of your lab draw.  You may receive a bill from [facility] for your lab work.  Please note if you are on Hydroxychloroquine  and and an order has been placed for a Hydroxychloroquine  level,  you will need to have it drawn 4 hours or more after your last dose.  If you wish to have your labs drawn at another location, please call the office 24 hours in advance so we can fax the orders.  The office is located at [address], [place], [place] [address]   If you have any questions regarding directions or hours of operation,  please call [phone].   As a reminder, please drink plenty of water prior to coming for your lab work. Thanks!   Vaccines You are taking a medication(s) that can suppress your immune system.  The following immunizations are recommended: Flu annually Covid-19  Td/Tdap (tetanus,  diphtheria, pertussis) every 10 years Pneumonia (Prevnar 15 then Pneumovax 23 at least 1 year apart.  Alternatively, can take Prevnar 20 without needing additional dose) Shingrix: 2 doses from 4 weeks to 6 months apart  Please check with your PCP to make sure you are up to date.   If you have signs or symptoms of an infection or start antibiotics: First, call your PCP for workup of your infection. Hold your medication through the infection, until you complete your antibiotics, and until symptoms resolve if you take the following: Injectable medication (Actemra, Benlysta, Cimzia, Cosentyx, Enbrel, Humira , Kevzara, Orencia, Remicade, Simponi, Stelara, Taltz, Tremfya) Methotrexate  Leflunomide (Arava) Mycophenolate (Cellcept) Cloria Danger, Olumiant, or Rinvoq  Please get an annual eye examination to screen for skin cancer while you are on Humira .  Please use sunscreen and sun protection.  Heart Disease Prevention   Your inflammatory disease increases your risk of heart disease which includes heart attack, stroke, atrial fibrillation (irregular heartbeats), high blood pressure, heart failure and atherosclerosis (plaque in the arteries).  It is important to reduce your risk by:   Keep blood pressure, cholesterol, and blood sugar at healthy levels   Smoking Cessation   Maintain a healthy weight  BMI 20-25   Eat a healthy diet  Plenty of fresh fruit, vegetables, and whole grains  Limit saturated fats, foods high in sodium, and added sugars  DASH and Mediterranean diet   Increase physical activity  Recommend moderate physically activity  for 150 minutes per week/ 30 minutes a day for five days a week These can be broken up into three separate ten-minute sessions during the day.   Reduce Stress  Meditation, slow breathing exercises, yoga, coloring books  Dental visits twice a year

## 2023-07-20 LAB — QUANTIFERON-TB GOLD PLUS
Mitogen-NIL: 9.55 [IU]/mL
NIL: 0.05 [IU]/mL
QuantiFERON-TB Gold Plus: NEGATIVE
TB1-NIL: 0 [IU]/mL
TB2-NIL: <0 [IU]/mL

## 2023-07-21 ENCOUNTER — Ambulatory Visit: Payer: Self-pay | Admitting: Rheumatology

## 2023-07-21 NOTE — Progress Notes (Signed)
TB Gold negative

## 2023-09-07 ENCOUNTER — Other Ambulatory Visit: Payer: Self-pay

## 2023-09-07 MED ORDER — METHOTREXATE SODIUM 2.5 MG PO TABS: ORAL_TABLET | ORAL | 0 refills | Status: DC

## 2023-09-07 NOTE — Telephone Encounter (Signed)
 Last Fill: 05/29/2023  Labs: 04/16/2023 Triglycerides elevated, LDL elevated, HDL low CBC normal, CMP shows low calcium.  Patient should increase calcium intake to 1200 mg daily   Contacted the patient to update labs.   Next Visit: 12/17/2023  Last Visit: 07/16/2023  DX: Rheumatoid arthritis with rheumatoid factor of multiple sites without organ or systems involvement   Current Dose per office note 07/16/2023: Methotrexate  3 tablets by mouth twice weekly   Okay to refill Methotrexate ?

## 2023-09-11 ENCOUNTER — Other Ambulatory Visit: Payer: Self-pay | Admitting: *Deleted

## 2023-09-11 DIAGNOSIS — Z79899 Other long term (current) drug therapy: Secondary | ICD-10-CM

## 2023-09-11 LAB — CBC WITH DIFFERENTIAL/PLATELET
Absolute Lymphocytes: 3157 {cells}/uL (ref 850–3900)
Absolute Monocytes: 342 {cells}/uL (ref 200–950)
Basophils Absolute: 12 {cells}/uL (ref 0–200)
Basophils Relative: 0.2 %
Eosinophils Absolute: 183 {cells}/uL (ref 15–500)
Eosinophils Relative: 3.1 %
HCT: 43.5 % (ref 35.0–45.0)
Hemoglobin: 14.1 g/dL (ref 11.7–15.5)
MCH: 32.4 pg (ref 27.0–33.0)
MCHC: 32.4 g/dL (ref 32.0–36.0)
MCV: 100 fL (ref 80.0–100.0)
MPV: 9.9 fL (ref 7.5–12.5)
Monocytes Relative: 5.8 %
Neutro Abs: 2207 {cells}/uL (ref 1500–7800)
Neutrophils Relative %: 37.4 %
Platelets: 211 Thousand/uL (ref 140–400)
RBC: 4.35 Million/uL (ref 3.80–5.10)
RDW: 12.9 % (ref 11.0–15.0)
Total Lymphocyte: 53.5 %
WBC: 5.9 Thousand/uL (ref 3.8–10.8)

## 2023-09-11 LAB — COMPREHENSIVE METABOLIC PANEL WITH GFR
AG Ratio: 1.7 (calc) (ref 1.0–2.5)
ALT: 12 U/L (ref 6–29)
AST: 15 U/L (ref 10–30)
Albumin: 4 g/dL (ref 3.6–5.1)
Alkaline phosphatase (APISO): 58 U/L (ref 31–125)
BUN: 11 mg/dL (ref 7–25)
CO2: 27 mmol/L (ref 20–32)
Calcium: 8.5 mg/dL — ABNORMAL LOW (ref 8.6–10.2)
Chloride: 104 mmol/L (ref 98–110)
Creat: 0.86 mg/dL (ref 0.50–0.97)
Globulin: 2.4 g/dL (ref 1.9–3.7)
Glucose, Bld: 86 mg/dL (ref 65–99)
Potassium: 4.3 mmol/L (ref 3.5–5.3)
Sodium: 137 mmol/L (ref 135–146)
Total Bilirubin: 0.5 mg/dL (ref 0.2–1.2)
Total Protein: 6.4 g/dL (ref 6.1–8.1)
eGFR: 89 mL/min/1.73m2 (ref >=60)

## 2023-09-13 ENCOUNTER — Ambulatory Visit: Payer: Self-pay | Admitting: Rheumatology

## 2023-09-13 NOTE — Progress Notes (Signed)
 CBC and CMP are normal except calcium is low. Pt. should take total calcium 1200 mg daily ( between diet an supplement).

## 2023-10-02 IMAGING — MR MR FOOT*R* W/O CM
4 of 5 series · 23 of 40 positions shown · non-contrast
Comparison: None Available.

CLINICAL DATA: Right forefoot pain.  Soft tissue mass.

EXAM:
MRI OF THE RIGHT FOREFOOT WITHOUT CONTRAST
TECHNIQUE: Multiplanar, multisequence MR imaging of the right forefoot was
performed. No intravenous contrast was administered.

[Series 4: T1 · coronal · 3.0mm · 0.19mm/px · 3 of 44 slices shown (1 of 2)]
[im 5/44]
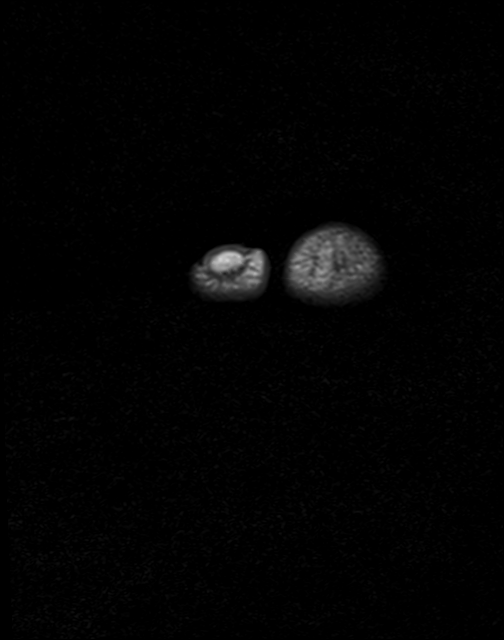
[im 24/44]
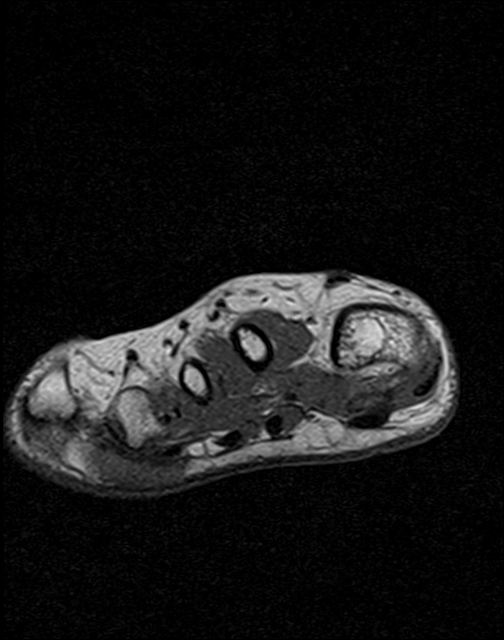
[im 39/44]
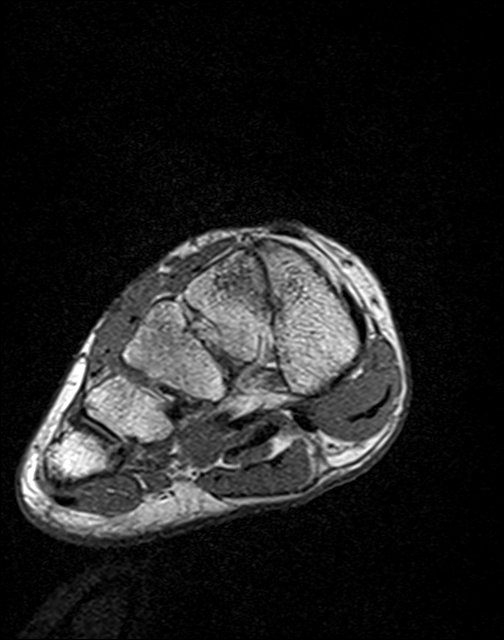

[Series 6: T1 · axial · 3.0mm · 0.35mm/px · z∈[-126,-62]mm · 3 of 22 slices shown (2 of 2)]
[im 5/22]
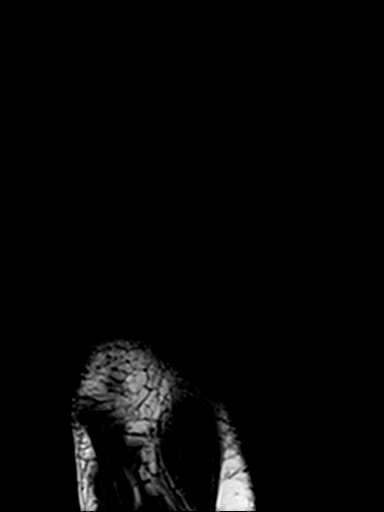
[im 13/22]
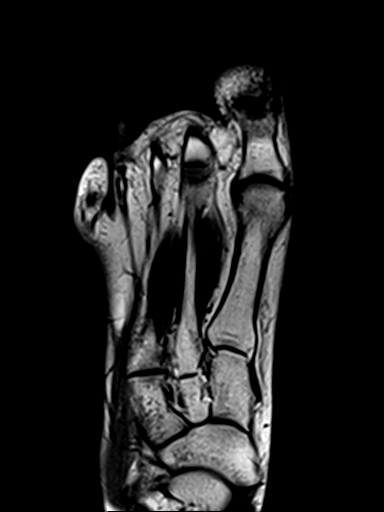
[im 22/22]
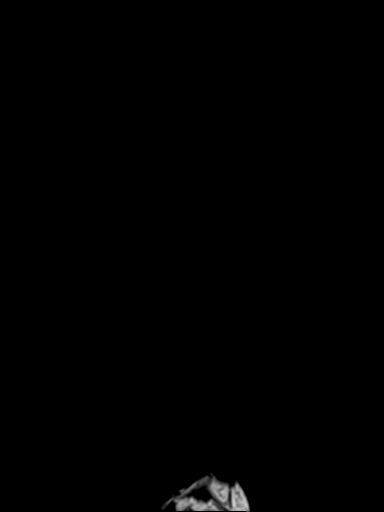

[Series 7: T2 fat-sat · coronal · 3.0mm · 0.23mm/px · 11 of 44 slices shown (1 of 2)]
[im 1/44]
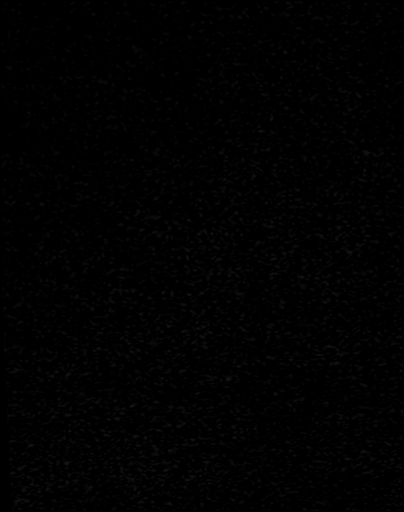
[im 5/44]
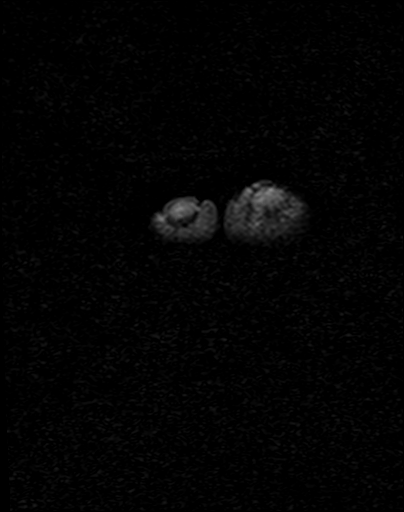
[im 9/44]
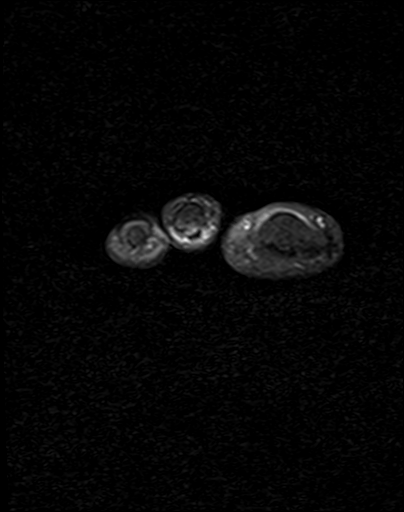
[im 13/44]
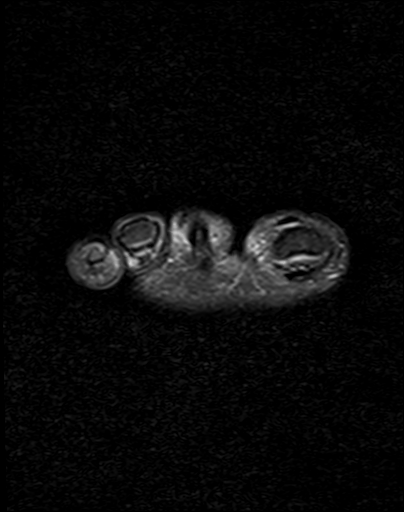
[im 18/44]
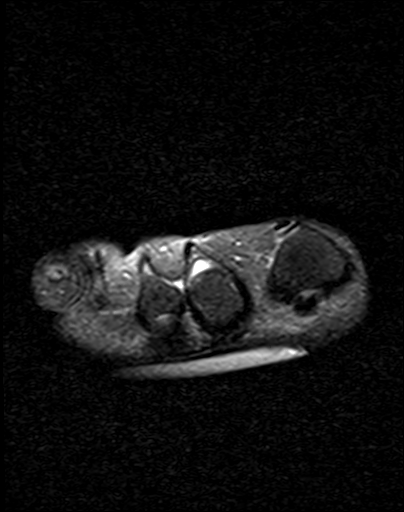
[im 22/44]
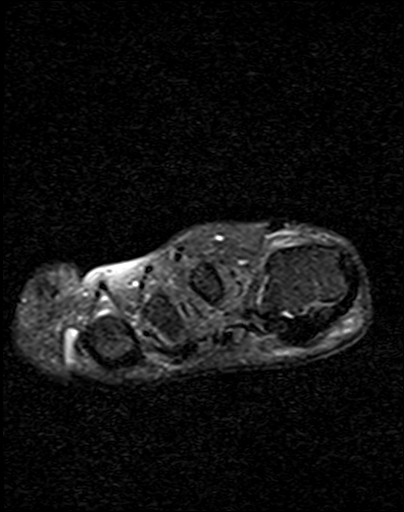
[im 26/44]
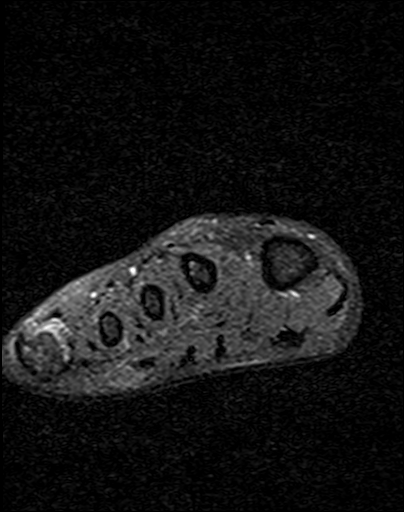
[im 31/44]
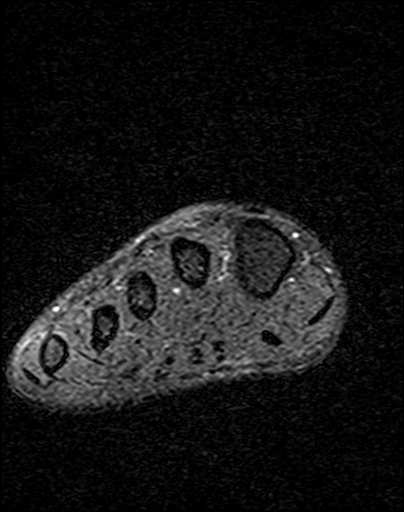
[im 35/44]
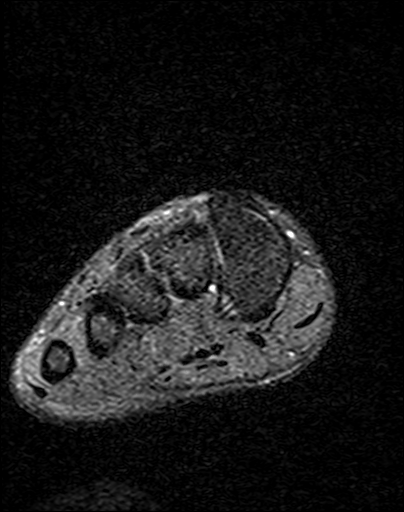
[im 39/44]
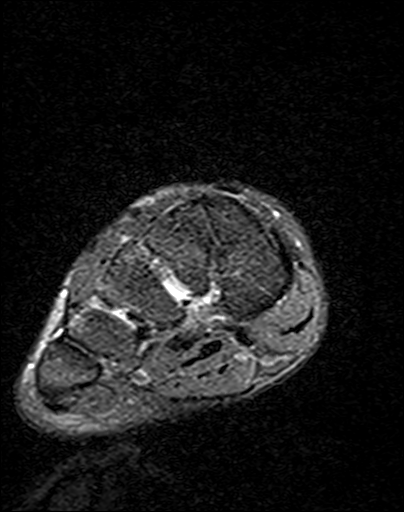
[im 44/44]
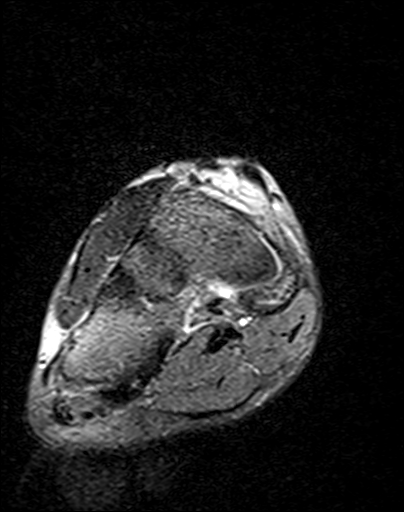

[Series 8: T2 fat-sat · axial · 3.0mm · 0.35mm/px · z∈[-141,-62]mm · 6 of 22 slices shown (2 of 2)]
[im 1/22]
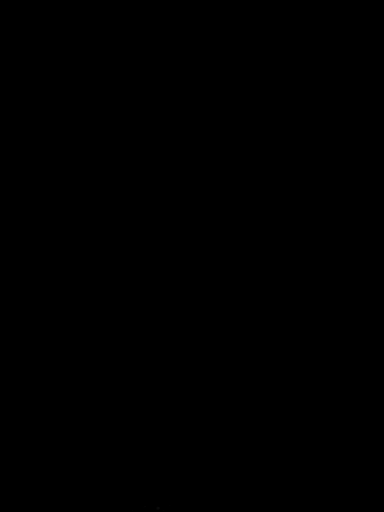
[im 5/22]
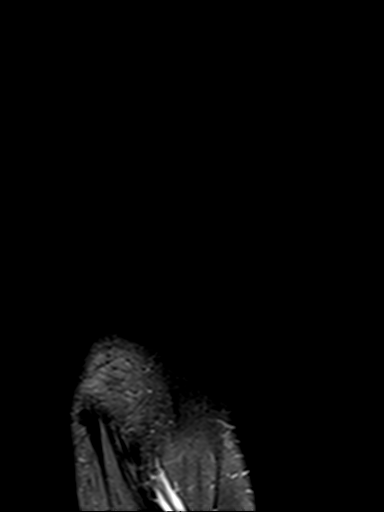
[im 9/22]
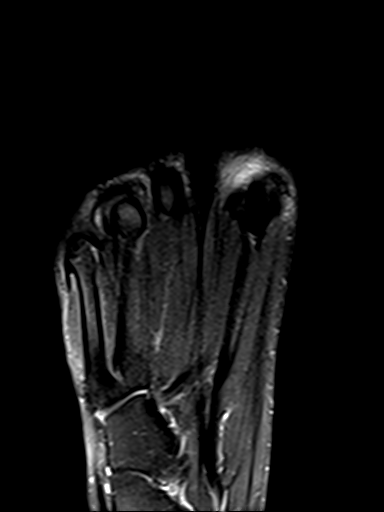
[im 13/22]
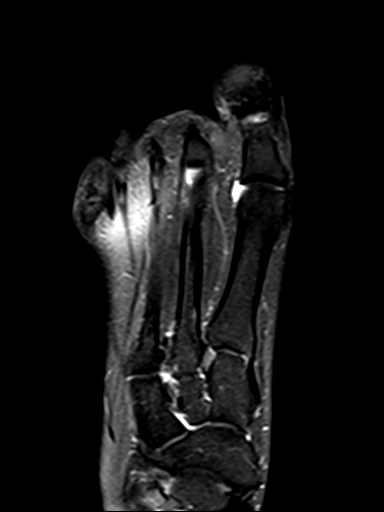
[im 17/22]
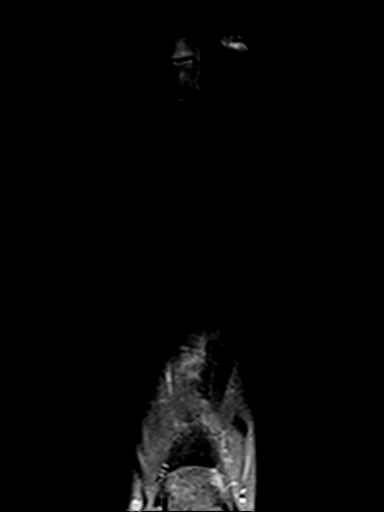
[im 22/22]
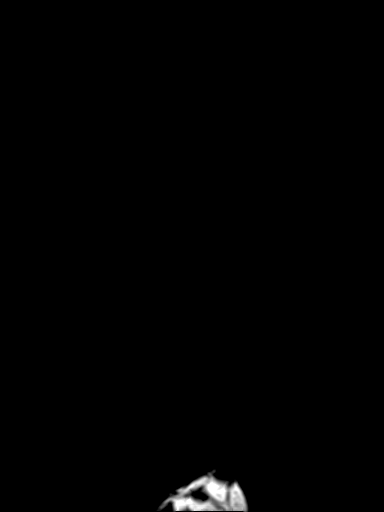

[23 of 40 positions shown; findings below may reference images not displayed]

FINDINGS: Bones/Joint/Cartilage

No fracture or dislocation. Normal alignment. No joint effusion. No
marrow signal abnormality. No erosive changes.

Ligaments

Collateral ligaments are intact.  Lisfranc ligament is intact.

Muscles and Tendons

Flexor, peroneal and extensor compartment tendons are intact.
Muscles are normal.

Soft tissue
Thick wall complex fluid collection overlying the plantar aspect of
the fourth MTP joint concerning for sequela of chronic adventitial
bursitis. Similar thick-walled structure overlying the plantar
aspect of the first MTP joint likely reflecting a chronic
decompressed adventitial bursa. Smaller soft tissue areas in the
subcutaneous fat overlying the plantar aspect of the second and
third MTP joints may reflect pressure fibrosis versus decompressed
adventitial bursas. No soft tissue mass.
IMPRESSION: 1. Thick wall complex fluid collection overlying the plantar aspect
of the fourth MTP joint concerning for chronic adventitial bursitis.
Similar thick-walled structure overlying the plantar aspect of the
first MTP joint likely reflecting a chronic decompressed adventitial
bursa. Smaller soft tissue areas in the subcutaneous fat overlying
the plantar aspect of the second and third MTP joints may reflect
pressure fibrosis versus decompressed adventitial bursas.

## 2023-10-09 ENCOUNTER — Other Ambulatory Visit: Payer: Self-pay | Admitting: Physician Assistant

## 2023-10-09 NOTE — Telephone Encounter (Signed)
 Last Fill: 09/07/2023  Labs: 09/11/2023  CBC and CMP are normal except calcium is low. Pt. should take total calcium 1200 mg daily ( between diet an supplement).   Next Visit: 12/17/2023  Last Visit: 07/16/2023  DX: Rheumatoid arthritis with rheumatoid factor of multiple sites without organ or systems involvement   Current Dose per office note 07/16/2023: Methotrexate  3 tablets by mouth twice weekly   Okay to refill Methotrexate ?

## 2023-10-20 ENCOUNTER — Other Ambulatory Visit: Payer: Self-pay | Admitting: Rheumatology

## 2023-10-20 DIAGNOSIS — Z79899 Other long term (current) drug therapy: Secondary | ICD-10-CM

## 2023-10-20 DIAGNOSIS — M0579 Rheumatoid arthritis with rheumatoid factor of multiple sites without organ or systems involvement: Secondary | ICD-10-CM

## 2023-10-20 NOTE — Telephone Encounter (Signed)
 Last Fill: 07/02/2023  Labs: 09/11/2023 CBC and CMP are normal except calcium is low. Pt. should take total calcium 1200 mg daily ( between diet an supplement).   TB Gold: 07/16/2023 negative    Next Visit: 12/17/2023  Last Visit: 07/16/2023  DX: Rheumatoid arthritis with rheumatoid factor of multiple sites without organ or systems involvement   Current Dose per office note on 07/16/2023: Hyrimoz  40 mg sq injections once every 14 days   Okay to refill Hyrimoz ?

## 2023-12-03 NOTE — Progress Notes (Signed)
 Office Visit Note  Patient: Ruth Wells             Date of Birth: [birth year]           MRN: [id number]             PCP: Vernadine Charlie ORN, MD Referring: Vernadine Charlie ORN, MD Visit Date: 12/17/2023 Occupation: Data Unavailable  Subjective:  Medication monitoring  History of Present Illness: Ruth Wells is a 38 y.o. female with history of seropositive rheumatoid arthritis and osteoarthritis.  Patient remains on  Hyrimoz  40 mg sq injections once every 14 days, Methotrexate  3 tablets by mouth twice weekly, and folic acid  2 mg by mouth daily.  She is tolerating combination therapy without any side effects and has not had any gaps in therapy.  Patient reports that she had a minor flare lasting for about 24 hours after the recent COVID-vaccine.  She denies any other signs or symptoms of a flare.  Her symptoms have been well-controlled on the current treatment regimen.  She denies any morning stiffness, nocturnal pain, or difficulty performing ADLs.  She denies any recent or recurrent infections.      Activities of Daily Living:  Patient reports morning stiffness for 0 minute.   Patient Denies nocturnal pain.  Difficulty dressing/grooming: Denies Difficulty climbing stairs: Denies Difficulty getting out of chair: Denies Difficulty using hands for taps, buttons, cutlery, and/or writing: Denies  Review of Systems  Constitutional:  Positive for fatigue.  HENT:  Negative for mouth sores and mouth dryness.   Eyes:  Negative for dryness.  Respiratory:  Negative for shortness of breath.   Cardiovascular:  Negative for chest pain and palpitations.  Gastrointestinal:  Negative for blood in stool, constipation and diarrhea.  Endocrine: Negative for increased urination.  Genitourinary:  Negative for involuntary urination.  Musculoskeletal:  Negative for joint pain, gait problem, joint pain, joint swelling, myalgias, muscle weakness, morning stiffness, muscle tenderness and  myalgias.  Skin:  Positive for sensitivity to sunlight. Negative for color change, rash and hair loss.  Allergic/Immunologic: Negative for susceptible to infections.  Neurological:  Negative for dizziness and headaches.  Hematological:  Negative for swollen glands.  Psychiatric/Behavioral:  Negative for depressed mood and sleep disturbance. The patient is not nervous/anxious.     PMFS History:  Patient Active Problem List   Diagnosis Date Noted   Primary osteoarthritis of both hands 02/24/2016   Primary osteoarthritis of both feet 02/24/2016   High risk medication use 02/15/2016   Raynaud's syndrome without gangrene 02/15/2016   Rheumatoid arthritis with rheumatoid factor of multiple sites without organ or systems involvement (HCC) 02/15/2016    Past Medical History:  Diagnosis Date   Arthritis    Hypertension     Family History  Problem Relation Age of Onset   Arthritis Mother    Breast cancer Mother    Stroke Father    Celiac disease Father    Past Surgical History:  Procedure Laterality Date   WISDOM TOOTH EXTRACTION     Social History   Tobacco Use   Smoking status: Never    Passive exposure: Never   Smokeless tobacco: Never  Vaping Use   Vaping status: Never Used  Substance Use Topics   Alcohol use: No   Drug use: No   Social History   Social History Narrative   Not on file     Immunization History  Administered Date(s) Administered   Moderna Covid-19 Vaccine Bivalent Booster 50yrs & up  11/16/2020   Moderna Sars-Covid-2 Vaccination 04/06/2020   PFIZER(Purple Top)SARS-COV-2 Vaccination 04/30/2019, 05/20/2019, 06/11/2019, 10/14/2019   Pfizer Covid-19 Vaccine Bivalent Booster 84yrs & up 07/13/2021   Pfizer(Comirnaty)Fall Seasonal Vaccine 12 years and older 12/06/2021   Pneumococcal Polysaccharide-23 02/26/2016     Objective: Vital Signs: BP 128/85   Pulse 80   Temp 97.7 F (36.5 C)   Resp 14   Ht 5' 5 (1.651 m)   Wt 172 lb (78 kg)   BMI 28.62  kg/m    Physical Exam Vitals and nursing note reviewed.  Constitutional:      Appearance: She is well-developed.  HENT:     Head: Normocephalic and atraumatic.  Eyes:     Conjunctiva/sclera: Conjunctivae normal.  Cardiovascular:     Rate and Rhythm: Normal rate and regular rhythm.     Heart sounds: Normal heart sounds.  Pulmonary:     Effort: Pulmonary effort is normal.     Breath sounds: Normal breath sounds.  Abdominal:     General: Bowel sounds are normal.     Palpations: Abdomen is soft.  Musculoskeletal:     Cervical back: Normal range of motion.  Lymphadenopathy:     Cervical: No cervical adenopathy.  Skin:    General: Skin is warm and dry.     Capillary Refill: Capillary refill takes less than 2 seconds.  Neurological:     Mental Status: She is alert and oriented to person, place, and time.  Psychiatric:        Behavior: Behavior normal.      Musculoskeletal Exam: C-spine, thoracic spine, lumbar spine have good range of motion.  No midline spinal tenderness.  No SI joint tenderness.  Shoulder joints, elbow joints, wrist joints, MCPs, PIPs, DIPs have good range of motion with no synovitis.  Complete fist formation bilaterally.  Hip joints have good range of motion with no groin pain.  Knee joints have good range of motion no warmth or effusion.  Ankle joints have good range of motion no tenderness or joint swelling.  No evidence of Achilles tendinitis or plantar fasciitis.   CDAI Exam: CDAI Score: -- Patient Global: --; Provider Global: -- Swollen: --; Tender: -- Joint Exam 12/17/2023   No joint exam has been documented for this visit   There is currently no information documented on the homunculus. Go to the Rheumatology activity and complete the homunculus joint exam.  Investigation: No additional findings.  Imaging: No results found.  Recent Labs: Lab Results  Component Value Date   WBC 5.9 09/11/2023   HGB 14.1 09/11/2023   PLT 211 09/11/2023   NA  137 09/11/2023   K 4.3 09/11/2023   CL 104 09/11/2023   CO2 27 09/11/2023   GLUCOSE 86 09/11/2023   BUN 11 09/11/2023   CREATININE 0.86 09/11/2023   BILITOT 0.5 09/11/2023   ALKPHOS 42 10/07/2016   AST 15 09/11/2023   ALT 12 09/11/2023   PROT 6.4 09/11/2023   ALBUMIN 4.0 10/07/2016   CALCIUM 8.5 (L) 09/11/2023   GFRAA 103 06/14/2020   QFTBGOLDPLUS NEGATIVE 07/16/2023    Speciality Comments: PLQ Eye exam: 10/16/2021 [facility] At [facility] Follow up in 1 year Humira  06/23, Hyrimoz  08/07/22  Procedures:  No procedures performed Allergies: Patient has no known allergies.   Assessment / Plan:     Visit Diagnoses: Rheumatoid arthritis with rheumatoid factor of multiple sites without organ or systems involvement (HCC) - Positive RF, negative anti-CCP erosive disease, history of JRA: She has  no synovitis on examination today.  She has clinically been doing well on Hyrimoz  40 mg sq injections once every 14 days, methotrexate  3 tablets by mouth twice weekly, and folic acid  2 mg daily.  She is tolerating combination therapy without any side effects and has not had any gaps in therapy.  She recently had a minor flare lasting for about 24 hours after the COVID-vaccine but is otherwise not had any signs of active disease.  She has not been experiencing any morning stiffness, nocturnal pain, or difficulty performing ADLs.  She will remain on the current treatment regimen.  She was advised to notify us  if she develops signs or symptoms of a flare.  She will follow-up in the office in 5 months or sooner if needed.  High risk medication use - Hyrimoz  40 mg sq injections once every 14 days, Methotrexate  3 tablets by mouth twice weekly, and folic acid  2 mg by mouth daily.  CBC and CMP updated on 09/11/23. Orders for CBC and CMP released today.  Her next lab work will be due at the end of January and every 3 months to monitor for drug toxicity. TB gold negative on 07/16/23 Lipid panel updated on  04/16/23.  No recent or recurrent infections. Discussed the importance of holding hyrimoz  and methotrexate  if she develops signs or symptoms of an infection and to resume once the infection has completely cleared.  Patient received the COVID-vaccine recently. - Plan: CBC with Differential/Platelet, Comprehensive metabolic panel with GFR  Primary osteoarthritis of both hands: No tenderness or synovitis.  Complete fist formation bilaterally.  Primary osteoarthritis of both feet - She is seen Dr. Verta in the past.  She is not experiencing any increased discomfort in her feet at this time.  She has good range of motion of both ankle joints with no tenderness or joint swelling.  Raynaud's syndrome without gangrene:  Not currently symptomatic.  No signs of sclerodactyly.  Other medical conditions are listed as follows:   Dyslipidemia  Family history of celiac disease-father  Family history of ankylosing spondylitis-paternal aunt  Family history of rheumatoid arthritis-maternal grandmother  Orders: Orders Placed This Encounter  Procedures   CBC with Differential/Platelet   Comprehensive metabolic panel with GFR   No orders of the defined types were placed in this encounter.   Follow-Up Instructions: Return in about 5 months (around 05/16/2024) for Rheumatoid arthritis, Osteoarthritis.   Waddell CHRISTELLA Craze, PA-C  Note - This record has been created using Dragon software.  Chart creation errors have been sought, but may not always  have been located. Such creation errors do not reflect on  the standard of medical care.

## 2023-12-17 ENCOUNTER — Ambulatory Visit: Attending: Physician Assistant | Admitting: Physician Assistant

## 2023-12-17 ENCOUNTER — Encounter: Payer: Self-pay | Admitting: Physician Assistant

## 2023-12-17 VITALS — BP 128/85 | HR 80 | Temp 97.7°F | Resp 14 | Ht 65.0 in | Wt 172.0 lb

## 2023-12-17 DIAGNOSIS — Z79899 Other long term (current) drug therapy: Secondary | ICD-10-CM | POA: Diagnosis not present

## 2023-12-17 DIAGNOSIS — M19041 Primary osteoarthritis, right hand: Secondary | ICD-10-CM | POA: Diagnosis not present

## 2023-12-17 DIAGNOSIS — I7300 Raynaud's syndrome without gangrene: Secondary | ICD-10-CM

## 2023-12-17 DIAGNOSIS — M19072 Primary osteoarthritis, left ankle and foot: Secondary | ICD-10-CM

## 2023-12-17 DIAGNOSIS — M19071 Primary osteoarthritis, right ankle and foot: Secondary | ICD-10-CM | POA: Diagnosis not present

## 2023-12-17 DIAGNOSIS — Z8379 Family history of other diseases of the digestive system: Secondary | ICD-10-CM

## 2023-12-17 DIAGNOSIS — Z8269 Family history of other diseases of the musculoskeletal system and connective tissue: Secondary | ICD-10-CM

## 2023-12-17 DIAGNOSIS — E785 Hyperlipidemia, unspecified: Secondary | ICD-10-CM

## 2023-12-17 DIAGNOSIS — Z8261 Family history of arthritis: Secondary | ICD-10-CM

## 2023-12-17 DIAGNOSIS — M19042 Primary osteoarthritis, left hand: Secondary | ICD-10-CM

## 2023-12-17 DIAGNOSIS — M0579 Rheumatoid arthritis with rheumatoid factor of multiple sites without organ or systems involvement: Secondary | ICD-10-CM

## 2023-12-17 NOTE — Patient Instructions (Signed)

## 2023-12-18 LAB — CBC WITH DIFFERENTIAL/PLATELET
Absolute Lymphocytes: 3977 {cells}/uL — ABNORMAL HIGH (ref 850–3900)
Absolute Monocytes: 669 {cells}/uL (ref 200–950)
Basophils Absolute: 19 {cells}/uL (ref 0–200)
Basophils Relative: 0.2 %
Eosinophils Absolute: 87 {cells}/uL (ref 15–500)
Eosinophils Relative: 0.9 %
HCT: 44.9 % (ref 35.0–45.0)
Hemoglobin: 15 g/dL (ref 11.7–15.5)
MCH: 33.4 pg — ABNORMAL HIGH (ref 27.0–33.0)
MCHC: 33.4 g/dL (ref 32.0–36.0)
MCV: 100 fL (ref 80.0–100.0)
MPV: 10 fL (ref 7.5–12.5)
Monocytes Relative: 6.9 %
Neutro Abs: 4947 {cells}/uL (ref 1500–7800)
Neutrophils Relative %: 51 %
Platelets: 272 Thousand/uL (ref 140–400)
RBC: 4.49 Million/uL (ref 3.80–5.10)
RDW: 12.4 % (ref 11.0–15.0)
Total Lymphocyte: 41 %
WBC: 9.7 Thousand/uL (ref 3.8–10.8)

## 2023-12-18 LAB — COMPREHENSIVE METABOLIC PANEL WITH GFR
AG Ratio: 1.7 (calc) (ref 1.0–2.5)
ALT: 14 U/L (ref 6–29)
AST: 16 U/L (ref 10–30)
Albumin: 4.3 g/dL (ref 3.6–5.1)
Alkaline phosphatase (APISO): 57 U/L (ref 31–125)
BUN: 9 mg/dL (ref 7–25)
CO2: 27 mmol/L (ref 20–32)
Calcium: 8.9 mg/dL (ref 8.6–10.2)
Chloride: 104 mmol/L (ref 98–110)
Creat: 0.9 mg/dL (ref 0.50–0.97)
Globulin: 2.6 g/dL (ref 1.9–3.7)
Glucose, Bld: 75 mg/dL (ref 65–99)
Potassium: 4.3 mmol/L (ref 3.5–5.3)
Sodium: 137 mmol/L (ref 135–146)
Total Bilirubin: 0.5 mg/dL (ref 0.2–1.2)
Total Protein: 6.9 g/dL (ref 6.1–8.1)
eGFR: 84 mL/min/1.73m2 (ref >=60)

## 2023-12-20 ENCOUNTER — Ambulatory Visit: Payer: Self-pay | Admitting: Physician Assistant

## 2023-12-20 NOTE — Progress Notes (Signed)
 CMP WNL Absolute lymphocytes are slightly elevated. We will continue to monitor.

## 2023-12-22 NOTE — Progress Notes (Signed)
 Yes, that is the likely explanation.

## 2023-12-22 NOTE — Telephone Encounter (Signed)
 Contacted patient to advise that Yes, that is the likely explanation.

## 2023-12-27 ENCOUNTER — Other Ambulatory Visit: Payer: Self-pay | Admitting: Rheumatology

## 2023-12-28 NOTE — Telephone Encounter (Signed)
 Last Fill: 10/09/2023  Labs: 12/17/2023 CMP WNL Absolute lymphocytes are slightly elevated. We will continue to monitor.    Next Visit: 05/25/2024  Last Visit: 12/17/2023  DX: Rheumatoid arthritis with rheumatoid factor of multiple sites without organ or systems involvement   Current Dose per office note on 12/17/2023: Methotrexate  3 tablets by mouth twice weekly   Okay to refill Methotrexate ?

## 2024-01-14 ENCOUNTER — Other Ambulatory Visit: Payer: Self-pay | Admitting: Rheumatology

## 2024-01-14 DIAGNOSIS — Z79899 Other long term (current) drug therapy: Secondary | ICD-10-CM

## 2024-01-14 DIAGNOSIS — M0579 Rheumatoid arthritis with rheumatoid factor of multiple sites without organ or systems involvement: Secondary | ICD-10-CM

## 2024-01-14 NOTE — Telephone Encounter (Signed)
 Last Fill: 10/20/2023  Labs: 12/17/2023  CMP WNL Absolute lymphocytes are slightly elevated. We will continue to monitor  TB Gold: 07/16/2023 negative    Next Visit: 05/25/2024  Last Visit: 12/17/2023  DX: Rheumatoid arthritis with rheumatoid factor of multiple sites without organ or systems involvement   Current Dose per office note on 12/17/2023: Hyrimoz  40 mg sq injections once every 14 days   Okay to refill Hyrimoz ?

## 2024-05-25 ENCOUNTER — Ambulatory Visit: Admitting: Rheumatology
